# Patient Record
Sex: Female | Born: 1965 | Hispanic: No | Marital: Married | Smoking: Former smoker
Health system: Southern US, Community
[De-identification: ages and names within clinical notes are randomized; demographics above are authoritative.]

## PROBLEM LIST (undated history)

## (undated) DIAGNOSIS — I10 Essential (primary) hypertension: Secondary | ICD-10-CM

## (undated) DIAGNOSIS — K219 Gastro-esophageal reflux disease without esophagitis: Secondary | ICD-10-CM

## (undated) DIAGNOSIS — E039 Hypothyroidism, unspecified: Secondary | ICD-10-CM

## (undated) HISTORY — DX: Gastro-esophageal reflux disease without esophagitis: K21.9

## (undated) HISTORY — DX: Essential (primary) hypertension: I10

## (undated) HISTORY — PX: UPPER GI ENDOSCOPY: SHX6162

---

## 2006-11-26 ENCOUNTER — Emergency Department: Payer: Self-pay | Admitting: Emergency Medicine

## 2006-12-10 ENCOUNTER — Ambulatory Visit: Payer: Self-pay | Admitting: Internal Medicine

## 2007-05-31 ENCOUNTER — Ambulatory Visit: Payer: Self-pay | Admitting: Internal Medicine

## 2007-06-13 ENCOUNTER — Ambulatory Visit: Payer: Self-pay | Admitting: Internal Medicine

## 2007-06-24 ENCOUNTER — Ambulatory Visit: Payer: Self-pay | Admitting: Internal Medicine

## 2007-12-24 ENCOUNTER — Ambulatory Visit: Payer: Self-pay | Admitting: Internal Medicine

## 2007-12-26 HISTORY — PX: BREAST BIOPSY: SHX20

## 2008-07-09 ENCOUNTER — Ambulatory Visit: Payer: Self-pay | Admitting: Internal Medicine

## 2008-07-13 ENCOUNTER — Ambulatory Visit: Payer: Self-pay | Admitting: Internal Medicine

## 2008-09-30 ENCOUNTER — Ambulatory Visit: Payer: Self-pay | Admitting: Cardiovascular Disease

## 2008-09-30 ENCOUNTER — Ambulatory Visit: Payer: Self-pay | Admitting: Unknown Physician Specialty

## 2008-10-01 ENCOUNTER — Ambulatory Visit: Payer: Self-pay | Admitting: Unknown Physician Specialty

## 2009-03-27 HISTORY — PX: ABDOMINAL HYSTERECTOMY: SHX81

## 2009-03-29 ENCOUNTER — Ambulatory Visit: Payer: Self-pay | Admitting: Unknown Physician Specialty

## 2009-04-02 ENCOUNTER — Ambulatory Visit: Payer: Self-pay | Admitting: Unknown Physician Specialty

## 2010-09-12 ENCOUNTER — Ambulatory Visit: Payer: Self-pay | Admitting: Internal Medicine

## 2010-10-06 ENCOUNTER — Ambulatory Visit: Payer: Self-pay | Admitting: Internal Medicine

## 2010-10-14 ENCOUNTER — Observation Stay: Payer: Self-pay | Admitting: Internal Medicine

## 2010-10-25 ENCOUNTER — Ambulatory Visit: Payer: Self-pay | Admitting: Unknown Physician Specialty

## 2010-11-07 ENCOUNTER — Ambulatory Visit: Payer: Self-pay | Admitting: Unknown Physician Specialty

## 2012-02-13 LAB — HEPATIC FUNCTION PANEL
ALT: 18 U/L (ref 7–35)
AST: 16 U/L (ref 13–35)
Bilirubin, Direct: 84 mg/dL — AB

## 2012-02-13 LAB — CBC AND DIFFERENTIAL: Hemoglobin: 7.6 g/dL — AB (ref 12.0–16.0)

## 2012-02-13 LAB — BASIC METABOLIC PANEL
BUN: 9 mg/dL (ref 4–21)
Creatinine: 0.9 mg/dL (ref <=1.1)

## 2012-03-25 ENCOUNTER — Ambulatory Visit: Payer: Self-pay | Admitting: Surgery

## 2012-03-25 DIAGNOSIS — I10 Essential (primary) hypertension: Secondary | ICD-10-CM

## 2012-03-27 HISTORY — PX: CHOLECYSTECTOMY: SHX55

## 2012-04-01 ENCOUNTER — Ambulatory Visit: Payer: Self-pay | Admitting: Surgery

## 2012-04-17 ENCOUNTER — Encounter: Payer: Self-pay | Admitting: *Deleted

## 2012-04-18 ENCOUNTER — Ambulatory Visit: Payer: Self-pay | Admitting: Internal Medicine

## 2012-06-12 ENCOUNTER — Encounter: Payer: Self-pay | Admitting: Internal Medicine

## 2012-06-26 ENCOUNTER — Telehealth: Payer: Self-pay | Admitting: Internal Medicine

## 2012-06-26 ENCOUNTER — Encounter: Payer: Self-pay | Admitting: Internal Medicine

## 2012-06-26 ENCOUNTER — Ambulatory Visit (INDEPENDENT_AMBULATORY_CARE_PROVIDER_SITE_OTHER): Payer: Managed Care, Other (non HMO) | Admitting: Internal Medicine

## 2012-06-26 VITALS — BP 120/84 | HR 77 | Temp 98.1°F | Ht 62.0 in | Wt 153.5 lb

## 2012-06-26 DIAGNOSIS — R079 Chest pain, unspecified: Secondary | ICD-10-CM | POA: Insufficient documentation

## 2012-06-26 DIAGNOSIS — K219 Gastro-esophageal reflux disease without esophagitis: Secondary | ICD-10-CM | POA: Insufficient documentation

## 2012-06-26 DIAGNOSIS — E039 Hypothyroidism, unspecified: Secondary | ICD-10-CM

## 2012-06-26 DIAGNOSIS — Z1239 Encounter for other screening for malignant neoplasm of breast: Secondary | ICD-10-CM

## 2012-06-26 DIAGNOSIS — G8929 Other chronic pain: Secondary | ICD-10-CM | POA: Insufficient documentation

## 2012-06-26 DIAGNOSIS — E7800 Pure hypercholesterolemia, unspecified: Secondary | ICD-10-CM

## 2012-06-26 LAB — BASIC METABOLIC PANEL
BUN: 15 mg/dL (ref 6–23)
CO2: 25 mEq/L (ref 19–32)
Calcium: 9.5 mg/dL (ref 8.4–10.5)
Creatinine, Ser: 0.8 mg/dL (ref 0.4–1.2)
Glucose, Bld: 88 mg/dL (ref 70–99)

## 2012-06-26 LAB — CBC WITH DIFFERENTIAL/PLATELET
Eosinophils Relative: 2 % (ref 0.0–5.0)
HCT: 36.7 % (ref 36.0–46.0)
Hemoglobin: 12.5 g/dL (ref 12.0–15.0)
Lymphs Abs: 1.9 10*3/uL (ref 0.7–4.0)
MCV: 89.3 fl (ref 78.0–100.0)
Monocytes Absolute: 0.4 10*3/uL (ref 0.1–1.0)
Monocytes Relative: 5.5 % (ref 3.0–12.0)
Neutro Abs: 4.5 10*3/uL (ref 1.4–7.7)
Platelets: 345 10*3/uL (ref 150.0–400.0)
WBC: 6.9 10*3/uL (ref 4.5–10.5)

## 2012-06-26 LAB — HEPATIC FUNCTION PANEL
ALT: 17 U/L (ref 0–35)
AST: 17 U/L (ref 0–37)
Alkaline Phosphatase: 56 U/L (ref 39–117)
Total Bilirubin: 0.5 mg/dL (ref 0.3–1.2)

## 2012-06-26 LAB — LIPID PANEL: Total CHOL/HDL Ratio: 7

## 2012-06-26 MED ORDER — LEVOTHYROXINE SODIUM 50 MCG PO TABS: 50.0000 ug | ORAL_TABLET | Freq: Every day | ORAL | Status: DC

## 2012-06-26 MED ORDER — PANTOPRAZOLE SODIUM 40 MG PO TBEC: 40.0000 mg | DELAYED_RELEASE_TABLET | Freq: Two times a day (BID) | ORAL | Status: DC

## 2012-06-26 NOTE — Progress Notes (Signed)
Subjective:    [name], female    DOB: [birth year], 47 y.o.   MRN: [id number]  HPI 47 year old female with past history of GERD, chest pain (with negative cardiac work up) with previous low functioning gall bladder who is now s/p cholecystectomy.  She comes in today to follow up on these issues as well as to establish care.  She states she started having discomfort in her chest in the spring of 2012.  Thought she had pulled a muscle.  Developed worsening chest pain.  Was evaluated in the ER and apparently admitted.  Had cardiac w/up then (including stress testing) that was unrevealing.  Saw Dr Gwen Pounds.  Was sent to Dr Markham Jordan.  Had EGD.  Revealed (per her report) inflammation and a small ulcer.  Was placed on prilosec.  Now taking protonix.  Continued to have intermittent flares.  HIDA revealed gall bladder function to be 18%.  Saw Dr Katrinka Blazing.  Gallbladder removed.  Did well for a while and now is having persistent discomfort and intermittent flares.  Same discomfort.  No significant change since her cholecystectomy.  This weekend at [place] food and drank orange juice.  Increased flare after this.  Taking protonix.  Can feel acid coming up in her throat.  No dysphagia now.  No odynophagia.  No vomiting.  Some increase in bowel movements after her gallbladder surgery.  The pain is not worsened by increased activity.  She actually feels better after going up flights of stairs.  Breathing stable.     Past Medical History  Diagnosis Date  . GERD (gastroesophageal reflux disease)   . Hypertension     Outpatient Encounter Prescriptions as of 06/26/2012  Medication Sig Dispense Refill  . [DISCONTINUED] pantoprazole (PROTONIX) 20 MG tablet Take 20 mg by mouth 2 (two) times daily.      . pantoprazole (PROTONIX) 40 MG tablet Take 1 tablet (40 mg total) by mouth 2 (two) times daily.  60 tablet  2   No facility-administered encounter medications on file as of 06/26/2012.    Review of  Systems Patient denies any headache, lightheadedness or dizziness.  No sinus or allergy symptoms.  No chest tightness or palpitations.  No increased shortness of breath, cough or congestion.  No vomiting.  Increased acid reflux as outlined.  Chest discomfort.  No actual abdominal pain.  No BRBPR or melana.  Bowels more frequent after the gallbladder surgery.  No urine change.   Has had previous issues with elevated blood pressure.  Was under increased stress at the time.  Was going through a divorce.  Was seeing Dr Alison Murray.  On blood pressure medication for a while.  Has been off for a while and blood pressure has been doing fine.  Last pelvic 2012.  Is s/p hysterectomy for bleeding.      Objective:   Physical Exam Filed Vitals:   06/26/12 0853  BP: 120/84  Pulse: 77  Temp: 98.1 F (84.72 C)   47 year old female in no acute distress.   HEENT:  Nares- clear.  Oropharynx - without lesions. NECK:  Supple.  Nontender.  No audible bruit.  HEART:  Appears to be regular. LUNGS:  No crackles or wheezing audible.  Respirations even and unlabored.  RADIAL PULSE:  Equal bilaterally.    CHEST WALL:  Some minimal discomfort with palpation - anterior chest.   ABDOMEN:  Soft, nontender.  Bowel sounds present and normal.  No audible abdominal bruit.  EXTREMITIES:  No increased edema present.  DP pulses palpable and equal bilaterally.      BACK:  Non tender.   SKIN:  No rash.       Assessment & Plan:  GYN.  Is s/p hysterectomy for increased bleeding.  Has done well.  Was followed by Dr Haskel Khan.   HISTORY OF ELEVATED BLOOD PRESSURE.  Appeared to have occurred with increased stress.  Now doing well on no medication.  Follow.    GALLBLADDER DYSFUNCTION.  Is s/p cholecystectomy.  Symptoms not improved.  Pursue GI w/up as outlined.   PULMONARY.  Breathing stable.  No increased sob reported with increased activity or exertion.   HEALTH MAINTENANCE.  Schedule her for a physical next visit.  Schedule  mammogram.  Overdue.  Obtain records for review.

## 2012-06-26 NOTE — Assessment & Plan Note (Signed)
Increased acid reflux and chest discomfort as outlined.  On protonix (20mg ) per pt.  Will increase to 40mg  bid.  Will also place her on Zantac 150mg  q hs. Will obtain met b, cbc, liver panel, amylase and lipase.  Refer back to GI for question of repeat EGD vs further w/up (especially given increased acid reflux despite medications).  Follow closely.

## 2012-06-26 NOTE — Patient Instructions (Addendum)
I want you to take protonix 40mg  - 2x/day - 30 minutes before breakfast and 30 minutes before your evening meal.  Take Zantac (ranitidine) 150mg  - before bedtime.  [name] will call you with your mammogram and GI appt.

## 2012-06-26 NOTE — Assessment & Plan Note (Signed)
Has had cardiac w/up in the recent past - including negative stress test.  Feels better when going up and down stairs.  Minimal tenderness to palpation.  Will change medication regimen as outlined.  Pursue GI w/up.  Follow.

## 2012-06-26 NOTE — Telephone Encounter (Signed)
Pt notified of lab results.  Low cholesterol diet/[place] diet.  tsh elevated.  Will start synthroid q day.  Check tsh in 4 weeks.  Pt aware of these orders/instructions.  She is planing on coming in 07/25/12 (8:00) for lab.  Please put on lab schedule.  Pt aware of appt time and date.

## 2012-06-27 ENCOUNTER — Telehealth: Payer: Self-pay | Admitting: Emergency Medicine

## 2012-06-27 ENCOUNTER — Encounter: Payer: Self-pay | Admitting: Emergency Medicine

## 2012-06-27 NOTE — Telephone Encounter (Signed)
Patient called and was wondering what day you made her an apt to come see you. She knows it was May but not sure of the date/time.

## 2012-06-27 NOTE — Telephone Encounter (Signed)
Appointment made

## 2012-06-27 NOTE — Telephone Encounter (Signed)
Called with appointment dates and time for patient. Left patient a message on her call voice mail.

## 2012-06-27 NOTE — Telephone Encounter (Signed)
In reviewing the appt schedule - it looks like she is scheduled for labs 07/25/12 and appt 09/02/12.

## 2012-07-24 ENCOUNTER — Ambulatory Visit: Payer: Self-pay | Admitting: Internal Medicine

## 2012-07-25 ENCOUNTER — Other Ambulatory Visit: Payer: Self-pay | Admitting: Internal Medicine

## 2012-07-25 ENCOUNTER — Other Ambulatory Visit (INDEPENDENT_AMBULATORY_CARE_PROVIDER_SITE_OTHER): Payer: Managed Care, Other (non HMO)

## 2012-07-25 ENCOUNTER — Other Ambulatory Visit: Payer: Self-pay | Admitting: *Deleted

## 2012-07-25 DIAGNOSIS — E039 Hypothyroidism, unspecified: Secondary | ICD-10-CM

## 2012-07-25 LAB — TSH: TSH: 14.51 u[IU]/mL — ABNORMAL HIGH (ref 0.35–5.50)

## 2012-07-25 MED ORDER — LEVOTHYROXINE SODIUM 75 MCG PO TABS: 75.0000 ug | ORAL_TABLET | Freq: Every day | ORAL | Status: DC

## 2012-07-25 NOTE — Progress Notes (Signed)
Order placed for follow up tsh

## 2012-08-21 ENCOUNTER — Encounter: Payer: Self-pay | Admitting: Internal Medicine

## 2012-08-30 ENCOUNTER — Other Ambulatory Visit (INDEPENDENT_AMBULATORY_CARE_PROVIDER_SITE_OTHER): Payer: Managed Care, Other (non HMO)

## 2012-08-30 DIAGNOSIS — E039 Hypothyroidism, unspecified: Secondary | ICD-10-CM

## 2012-09-02 ENCOUNTER — Other Ambulatory Visit (HOSPITAL_COMMUNITY)
Admission: RE | Admit: 2012-09-02 | Discharge: 2012-09-02 | Disposition: A | Discharge status: Home / Self Care | Payer: Managed Care, Other (non HMO) | Source: Ambulatory Visit | Attending: Internal Medicine | Admitting: Internal Medicine

## 2012-09-02 ENCOUNTER — Encounter: Payer: Self-pay | Admitting: Internal Medicine

## 2012-09-02 ENCOUNTER — Ambulatory Visit (INDEPENDENT_AMBULATORY_CARE_PROVIDER_SITE_OTHER): Payer: Managed Care, Other (non HMO) | Admitting: Internal Medicine

## 2012-09-02 VITALS — BP 120/86 | HR 75 | Temp 98.0°F | Ht 62.0 in | Wt 151.5 lb

## 2012-09-02 DIAGNOSIS — Z733 Stress, not elsewhere classified: Secondary | ICD-10-CM

## 2012-09-02 DIAGNOSIS — F439 Reaction to severe stress, unspecified: Secondary | ICD-10-CM

## 2012-09-02 DIAGNOSIS — R079 Chest pain, unspecified: Secondary | ICD-10-CM

## 2012-09-02 DIAGNOSIS — E039 Hypothyroidism, unspecified: Secondary | ICD-10-CM

## 2012-09-02 DIAGNOSIS — Z1151 Encounter for screening for human papillomavirus (HPV): Secondary | ICD-10-CM | POA: Insufficient documentation

## 2012-09-02 DIAGNOSIS — Z124 Encounter for screening for malignant neoplasm of cervix: Secondary | ICD-10-CM

## 2012-09-02 DIAGNOSIS — Z01419 Encounter for gynecological examination (general) (routine) without abnormal findings: Secondary | ICD-10-CM | POA: Insufficient documentation

## 2012-09-02 DIAGNOSIS — K219 Gastro-esophageal reflux disease without esophagitis: Secondary | ICD-10-CM

## 2012-09-04 ENCOUNTER — Encounter: Payer: Self-pay | Admitting: Internal Medicine

## 2012-09-04 DIAGNOSIS — E039 Hypothyroidism, unspecified: Secondary | ICD-10-CM | POA: Insufficient documentation

## 2012-09-04 NOTE — Assessment & Plan Note (Signed)
Has had cardiac w/up in the recent past - including negative stress test.  Feels better when going up and down stairs.  Minimal tenderness to palpation.  Referred to GI.  Planning for EGD as outlined.

## 2012-09-04 NOTE — Assessment & Plan Note (Signed)
Increased acid reflux and chest discomfort as outlined in previous note.  On protonix bid and zantac in the evening.  Saw GI.  Planning for EGD 09/17/12.

## 2012-09-04 NOTE — Progress Notes (Signed)
Subjective:    [name], female    DOB: 04-Sep-1965, 47 y.o.   MRN: [id number]  HPI 47 year old female with past history of GERD, chest pain (with negative cardiac work up) with previous low functioning gall bladder who is now s/p cholecystectomy.  She comes in today to follow up on these issues as well as for a complete physical exam.  She states she started having discomfort in her chest in the spring of 2012.  Thought she had pulled a muscle.  Developed worsening chest pain.  Was evaluated in the ER and apparently admitted.  Had cardiac w/up then (including stress testing) that was unrevealing.  Saw Dr Gwen Pounds.  Was sent to Dr Markham Jordan.  Had EGD.  Revealed (per her report) inflammation and a small ulcer.  Was placed on prilosec.  Now taking protonix.  Continued to have intermittent flares.  HIDA revealed gall bladder function to be 18%.  Saw Dr Katrinka Blazing.  Gallbladder removed.  Did well for a while, but then symptoms returned.  See last note for details.  No dysphagia or odynophagia.  No vomiting.  Saw GI.  Is planning for an EGD 09/17/12.  Taking protonix.   Breathing stable.     Past Medical History  Diagnosis Date  . GERD (gastroesophageal reflux disease)   . Hypertension     Outpatient Encounter Prescriptions as of 09/02/2012  Medication Sig Dispense Refill  . levothyroxine (SYNTHROID, LEVOTHROID) 75 MCG tablet Take 1 tablet (75 mcg total) by mouth daily.  30 tablet  3  . pantoprazole (PROTONIX) 40 MG tablet Take 1 tablet (40 mg total) by mouth 2 (two) times daily.  60 tablet  2   No facility-administered encounter medications on file as of 09/02/2012.    Current Outpatient Prescriptions on File Prior to Visit  Medication Sig Dispense Refill  . levothyroxine (SYNTHROID, LEVOTHROID) 75 MCG tablet Take 1 tablet (75 mcg total) by mouth daily.  30 tablet  3  . pantoprazole (PROTONIX) 40 MG tablet Take 1 tablet (40 mg total) by mouth 2 (two) times daily.  60 tablet  2   No  current facility-administered medications on file prior to visit.    Review of Systems Patient denies any headache, lightheadedness or dizziness.  No sinus or allergy symptoms.  No significant chest tightness or palpitations.  No increased shortness of breath, cough or congestion.  No vomiting.  No BRBPR or melana.  Bowels more frequent after the gallbladder surgery.  No urine change.   Some persistent GI issues.  See last note for details.  On protonix.  Saw GI.  Planning for EGD 09/17/12.  Has had previous issues with elevated blood pressure.  Was under increased stress at the time.  Was going through a divorce.  Was seeing Dr Alison Murray.  On blood pressure medication for a while.  Has been off for a while.   Is s/p hysterectomy for bleeding.       Objective:   Physical Exam  Filed Vitals:   09/02/12 1036  BP: 120/86  Pulse: 75  Temp: 98 F (59.19 C)   47 year old female in no acute distress.   HEENT:  Nares- clear.  Oropharynx - without lesions. NECK:  Supple.  Nontender.  No audible bruit.  HEART:  Appears to be regular. LUNGS:  No crackles or wheezing audible.  Respirations even and unlabored.  RADIAL PULSE:  Equal bilaterally.    BREASTS:  No nipple discharge or nipple  retraction present.  Could not appreciate any distinct nodules or axillary adenopathy.  ABDOMEN:  Soft, nontender.  Bowel sounds present and normal.  No audible abdominal bruit.  GU:  Normal external genitalia.  Vaginal vault without lesions.  Cervix identified.  Pap performed. Could not appreciate any adnexal masses or tenderness.   RECTAL:  Heme negative.   EXTREMITIES:  No increased edema present.  DP pulses palpable and equal bilaterally.          Assessment & Plan:  GYN.  Is s/p hysterectomy for increased bleeding.  Has done well.  Was followed by Dr Haskel Khan.  Pelvic today.   HISTORY OF ELEVATED BLOOD PRESSURE.  Appeared to have occurred with increased stress.  Now doing well on no medication.  Slightly  elevated today.  Have her spot check her pressure and get her back in soon to reassess.  Medication if persistent elevation.   GALLBLADDER DYSFUNCTION.  Is s/p cholecystectomy.  Symptoms not improved.  Pursue GI w/up as outlined.   PULMONARY.  Breathing stable.  No increased sob reported with increased activity or exertion.  INCREASED PSYCHOSOCIAL STRESSORS.  Increased stress at work.  Also dealing with some family issues.  Discussed at length with her today.  She is in agreement for counseling.  Refer to psychiatry for evaluation.   HEALTH MAINTENANCE.  Physical today.  Is s/p hysterectomy.  Mammogram 07/24/12 - Birads I.  Referred to GI.

## 2012-09-04 NOTE — Assessment & Plan Note (Signed)
On replacement.  New diagnosis.  TSH just checked and wnl.

## 2012-09-06 ENCOUNTER — Encounter: Payer: Self-pay | Admitting: Emergency Medicine

## 2012-10-07 ENCOUNTER — Telehealth: Payer: Self-pay | Admitting: Internal Medicine

## 2012-10-07 NOTE — Telephone Encounter (Signed)
Spoke with Marcelino Duster at [place] office, the patient no showed for her visit this morning. FYI

## 2012-10-16 ENCOUNTER — Other Ambulatory Visit: Payer: Self-pay | Admitting: Internal Medicine

## 2012-10-16 ENCOUNTER — Ambulatory Visit: Payer: Self-pay | Admitting: Internal Medicine

## 2012-11-07 ENCOUNTER — Other Ambulatory Visit: Payer: Self-pay | Admitting: Internal Medicine

## 2013-08-30 ENCOUNTER — Other Ambulatory Visit: Payer: Self-pay | Admitting: Internal Medicine

## 2013-09-03 ENCOUNTER — Encounter: Payer: Self-pay | Admitting: Internal Medicine

## 2013-09-03 ENCOUNTER — Ambulatory Visit (INDEPENDENT_AMBULATORY_CARE_PROVIDER_SITE_OTHER): Payer: Managed Care, Other (non HMO) | Admitting: Internal Medicine

## 2013-09-03 VITALS — BP 110/80 | HR 63 | Temp 98.3°F | Ht 62.0 in | Wt 119.8 lb

## 2013-09-03 DIAGNOSIS — Z1239 Encounter for other screening for malignant neoplasm of breast: Secondary | ICD-10-CM

## 2013-09-03 DIAGNOSIS — E7800 Pure hypercholesterolemia, unspecified: Secondary | ICD-10-CM

## 2013-09-03 DIAGNOSIS — E039 Hypothyroidism, unspecified: Secondary | ICD-10-CM

## 2013-09-03 DIAGNOSIS — R079 Chest pain, unspecified: Secondary | ICD-10-CM

## 2013-09-03 DIAGNOSIS — G8929 Other chronic pain: Secondary | ICD-10-CM

## 2013-09-03 DIAGNOSIS — K219 Gastro-esophageal reflux disease without esophagitis: Secondary | ICD-10-CM

## 2013-09-03 LAB — COMPREHENSIVE METABOLIC PANEL
ALBUMIN: 4.1 g/dL (ref 3.5–5.2)
ALK PHOS: 41 U/L (ref 39–117)
ALT: 20 U/L (ref 0–35)
AST: 20 U/L (ref 0–37)
BUN: 9 mg/dL (ref 6–23)
CALCIUM: 9.5 mg/dL (ref 8.4–10.5)
CHLORIDE: 107 meq/L (ref 96–112)
CO2: 24 mEq/L (ref 19–32)
Creatinine, Ser: 0.8 mg/dL (ref 0.4–1.2)
GFR: 78.59 mL/min (ref >60.00)
Glucose, Bld: 72 mg/dL (ref 70–99)
POTASSIUM: 4.1 meq/L (ref 3.5–5.1)
SODIUM: 139 meq/L (ref 135–145)
TOTAL PROTEIN: 7 g/dL (ref 6.0–8.3)
Total Bilirubin: 0.4 mg/dL (ref 0.2–1.2)

## 2013-09-03 LAB — LIPID PANEL
CHOLESTEROL: 208 mg/dL — AB (ref 0–200)
HDL: 48.5 mg/dL (ref >39.00)
LDL CALC: 142 mg/dL — AB (ref 0–99)
NONHDL: 159.5
Total CHOL/HDL Ratio: 4
Triglycerides: 90 mg/dL (ref 0.0–149.0)
VLDL: 18 mg/dL (ref 0.0–40.0)

## 2013-09-03 LAB — CBC WITH DIFFERENTIAL/PLATELET
BASOS PCT: 0.8 % (ref 0.0–3.0)
Basophils Absolute: 0 10*3/uL (ref 0.0–0.1)
Eosinophils Absolute: 0.1 10*3/uL (ref 0.0–0.7)
Eosinophils Relative: 1.3 % (ref 0.0–5.0)
HCT: 40.2 % (ref 36.0–46.0)
HEMOGLOBIN: 13.6 g/dL (ref 12.0–15.0)
LYMPHS PCT: 35.6 % (ref 12.0–46.0)
Lymphs Abs: 2.1 10*3/uL (ref 0.7–4.0)
MCHC: 33.8 g/dL (ref 30.0–36.0)
MCV: 90.7 fl (ref 78.0–100.0)
MONOS PCT: 7 % (ref 3.0–12.0)
Monocytes Absolute: 0.4 10*3/uL (ref 0.1–1.0)
NEUTROS ABS: 3.3 10*3/uL (ref 1.4–7.7)
NEUTROS PCT: 55.3 % (ref 43.0–77.0)
Platelets: 342 10*3/uL (ref 150.0–400.0)
RBC: 4.44 Mil/uL (ref 3.87–5.11)
RDW: 14.2 % (ref 11.5–15.5)
WBC: 5.9 10*3/uL (ref 4.0–10.5)

## 2013-09-03 NOTE — Progress Notes (Signed)
Subjective:    [name], female    DOB: Sep 05, 1965, 48 y.o.   MRN: [id number]  HPI 48 year old female with past history of GERD, chest pain (with negative cardiac work up) with previous low functioning gall bladder who is now s/p cholecystectomy.  She comes in today to follow up on these issues as well as for a complete physical exam.  She states she started having discomfort in her chest in the spring of 2012.  Thought she had pulled a muscle.  Developed worsening chest pain.  Was evaluated in the ER and apparently admitted.  Had cardiac w/up then (including stress testing) that was unrevealing.  Saw Dr Gwen PoundsKowalski.  Was sent to Dr Markham JordanElliot.  Had EGD.  Revealed (per her report) inflammation and a small ulcer.  Was placed on prilosec.  Then changed to protonix.  HIDA revealed gall bladder function to be 18%.  Saw Dr Katrinka BlazingSmith.  Gallbladder removed.  Did well for a while, but then symptoms returned.  See last note for details.  No dysphagia or odynophagia.  No vomiting.  Saw GI.  Had f/u EGD in 7/14.  Erythematous mucosa - antrum.  She has adjusted her diet.  Feels better.  Stomach better.  As long as she watches her diet, she has no symptoms.  Not needing the protonix now.  She has had minimal allergy issues.  Staying active.  No chest pain or tightness.  Bowels stable.     Past Medical History  Diagnosis Date  . GERD (gastroesophageal reflux disease)   . Hypertension     Outpatient Encounter Prescriptions as of 09/03/2013  Medication Sig  . levothyroxine (SYNTHROID, LEVOTHROID) 75 MCG tablet TAKE 1 TABLET BY MOUTH EVERY DAY  . pantoprazole (PROTONIX) 40 MG tablet TAKE ONE TABLET BY MOUTH TWICE DAILY    Current Outpatient Prescriptions on File Prior to Visit  Medication Sig Dispense Refill  . levothyroxine (SYNTHROID, LEVOTHROID) 75 MCG tablet TAKE 1 TABLET BY MOUTH EVERY DAY  30 tablet  0  . pantoprazole (PROTONIX) 40 MG tablet TAKE ONE TABLET BY MOUTH TWICE DAILY  60 tablet   11   No current facility-administered medications on file prior to visit.    Review of Systems Patient denies any headache, lightheadedness or dizziness.  No sinus or allergy symptoms.  No chest tightness, chest pain or palpitations.  No increased shortness of breath, cough or congestion.  No vomiting.  No BRBPR or melana.  Bowels stable.  No urine change.   No acid reflux.  Doing better since adjusted her diet.  Feels good.        Objective:   Physical Exam  Filed Vitals:   09/03/13 0911  BP: 110/80  Pulse: 63  Temp: 98.3 F (8936.678 C)   48 year old female in no acute distress.   HEENT:  Nares- clear.  Oropharynx - without lesions. NECK:  Supple.  Nontender.  No audible bruit.  HEART:  Appears to be regular. LUNGS:  No crackles or wheezing audible.  Respirations even and unlabored.  RADIAL PULSE:  Equal bilaterally.    BREASTS:  No nipple discharge or nipple retraction present.  Could not appreciate any distinct nodules or axillary adenopathy.  ABDOMEN:  Soft, nontender.  Bowel sounds present and normal.  No audible abdominal bruit.  GU:  Not performed.     EXTREMITIES:  No increased edema present.  DP pulses palpable and equal bilaterally.  Assessment & Plan:  GYN.  Is s/p hysterectomy for increased bleeding.  Has done well.  Was followed by Dr Haskel Khan.  PAP 09/02/12 negative with negative HPV.    GALLBLADDER DYSFUNCTION.  Is s/p cholecystectomy.  Doing well.   PULMONARY.  Breathing stable.  No increased sob reported with increased activity or exertion.  HEALTH MAINTENANCE.  Physical today.  Is s/p hysterectomy.  Mammogram 07/24/12 - Birads I.  Schedule f/u mammogram.   I spent 25 minutes with the patient and more than 50% of the time was spent in consultation regarding the above.

## 2013-09-03 NOTE — Assessment & Plan Note (Signed)
On replacement.  Check tsh.

## 2013-09-03 NOTE — Assessment & Plan Note (Signed)
EGD as outlined.  On no medication.  Has adjusted her diet.  Doing well.  Follow.

## 2013-09-03 NOTE — Progress Notes (Signed)
Pre visit review using our clinic review tool, if applicable. No additional management support is needed unless otherwise documented below in the visit note. 

## 2013-09-03 NOTE — Assessment & Plan Note (Signed)
Low cholesterol diet and exercise.  Check lipid panel today.   

## 2013-09-03 NOTE — Assessment & Plan Note (Signed)
Has had cardiac w/up in the recent past - including negative stress test.  Had extensive GI w/up.  Has adjusted her diet.  On no medication.  Doing well.

## 2013-09-04 LAB — TSH: TSH: 5.18 u[IU]/mL — ABNORMAL HIGH (ref 0.35–4.50)

## 2013-09-05 ENCOUNTER — Other Ambulatory Visit: Payer: Self-pay | Admitting: *Deleted

## 2013-09-05 ENCOUNTER — Other Ambulatory Visit: Payer: Self-pay | Admitting: Internal Medicine

## 2013-09-05 DIAGNOSIS — E7800 Pure hypercholesterolemia, unspecified: Secondary | ICD-10-CM

## 2013-09-05 MED ORDER — LEVOTHYROXINE SODIUM 88 MCG PO TABS: ORAL_TABLET | ORAL | Status: DC

## 2013-09-05 NOTE — Progress Notes (Signed)
Order placed for f/u labs.  

## 2013-10-17 ENCOUNTER — Other Ambulatory Visit: Payer: Self-pay

## 2014-03-09 ENCOUNTER — Other Ambulatory Visit: Payer: Self-pay

## 2014-07-17 NOTE — Op Note (Signed)
PATIENT NAME:  Loletha CarrowSHIELDS, Araiyah A MR#:  [id number] DATE OF BIRTH:  [birth year]  DATE OF PROCEDURE:  04/01/2012  PREOPERATIVE DIAGNOSIS: Chronic acalculous cholecystitis.   POSTOPERATIVE DIAGNOSIS: Chronic acalculous cholecystitis.  PROCEDURE: Laparoscopic cholecystectomy.   SURGEON: Renda RollsWilton Shaterria Sager, M.D.   ANESTHESIA: General.   INDICATIONS: This 49 year old female has a history of chest pains dating back to July of 2012, some pains in the left shoulder. She has had evaluation with upper endoscopy with minimal inflammation around the cardia and active chronic gastritis. She had a hepatobiliary scan and had an abnormally low gallbladder ejection fraction of 15% and just about 2 weeks prior to surgical evaluation had more episodes of chest pain. She also has some history of fatty food intolerance and laparoscopic cholecystectomy was recommended for further treatment.   DESCRIPTION OF PROCEDURE: The patient was placed on the operating table in the supine position under general endotracheal anesthesia. The abdomen was prepared with ChloraPrep and draped in a sterile manner.  A short incision was made in the inferior aspect of the umbilicus and carried down to the deep fascia which was grasped with laryngeal hook and elevated. A Veress needle was inserted, aspirated and irrigated with a saline solution. Next, the peritoneal cavity was inflated with carbon dioxide. The Veress needle was removed. The 10 mm cannula was inserted. The 10 mm, 0 degree laparoscope was inserted to view the peritoneal cavity. The liver appeared normal. The stomach appeared normal. Another incision was made in the epigastrium just to the right of the midline to introduce an 11 mm cannula. Two incisions were made in the lateral aspect of the right upper quadrant to introduce two 5 mm cannulas.   With the patient in the reverse Trendelenburg position and turned several degrees to the left, the gallbladder was retracted towards the  right shoulder. The neck of the gallbladder was retracted inferiorly and laterally. The porta hepatis was demonstrated. The cystic duct was dissected free from surrounding structures. The cystic artery was dissected free from surrounding structures. The neck of the gallbladder was mobilized with incision of the visceral peritoneum. A critical view of safety was demonstrated. An Endoclip Clip was placed across the cystic duct adjacent to the neck of the gallbladder. An incision was made in the cystic duct to introduce a Reddick catheter; however, the cystic duct was found to be smaller than the catheter. Therefore, the catheter would not thread in and therefore a cholangiogram was not done. The Reddick catheter was removed. The cystic duct was doubly ligated with endoclips and divided. The cystic artery was controlled with double endoclips and divided. The gallbladder was dissected free from the liver with hook and cautery. There was no bleeding during this portion of the procedure. The gallbladder was completely separated and was delivered out through the infraumbilical incision and submitted in formalin for routine pathology. The right upper quadrant was further inspected. Hemostasis was intact. The cannulas were removed. Carbon dioxide was allowed to escape from the peritoneal cavity. Skin incisions were closed with interrupted 5-0 chromic subcuticular sutures, benzoin and Steri-Strips. Dressings were applied with paper tape. The patient tolerated surgery satisfactorily and was then moved to the recovery room for postoperative care. ____________________________ J. Renda RollsWilton Enes Wegener, MD jws:sb D: 04/01/2012 08:39:36 ET T: 04/01/2012 09:12:07 ET JOB#: [id number]  cc: Adella HareJ. Wilton Johnisha Louks, MD, <Dictator> Adella HareWILTON J Vinia Jemmott MD ELECTRONICALLY SIGNED 04/01/2012 19:32

## 2014-08-18 ENCOUNTER — Other Ambulatory Visit: Payer: Self-pay | Admitting: Internal Medicine

## 2014-09-22 ENCOUNTER — Ambulatory Visit (INDEPENDENT_AMBULATORY_CARE_PROVIDER_SITE_OTHER): Payer: 59 | Admitting: Internal Medicine

## 2014-09-22 ENCOUNTER — Encounter: Payer: Self-pay | Admitting: Internal Medicine

## 2014-09-22 VITALS — BP 118/80 | HR 54 | Temp 98.0°F | Ht 62.0 in | Wt 130.1 lb

## 2014-09-22 DIAGNOSIS — E7800 Pure hypercholesterolemia, unspecified: Secondary | ICD-10-CM

## 2014-09-22 DIAGNOSIS — F439 Reaction to severe stress, unspecified: Secondary | ICD-10-CM

## 2014-09-22 DIAGNOSIS — G479 Sleep disorder, unspecified: Secondary | ICD-10-CM

## 2014-09-22 DIAGNOSIS — K219 Gastro-esophageal reflux disease without esophagitis: Secondary | ICD-10-CM

## 2014-09-22 DIAGNOSIS — Z0000 Encounter for general adult medical examination without abnormal findings: Secondary | ICD-10-CM

## 2014-09-22 DIAGNOSIS — Z658 Other specified problems related to psychosocial circumstances: Secondary | ICD-10-CM

## 2014-09-22 DIAGNOSIS — E039 Hypothyroidism, unspecified: Secondary | ICD-10-CM | POA: Diagnosis not present

## 2014-09-22 DIAGNOSIS — Z1239 Encounter for other screening for malignant neoplasm of breast: Secondary | ICD-10-CM

## 2014-09-22 DIAGNOSIS — R519 Headache, unspecified: Secondary | ICD-10-CM

## 2014-09-22 DIAGNOSIS — R51 Headache: Secondary | ICD-10-CM

## 2014-09-22 MED ORDER — LEVOTHYROXINE SODIUM 88 MCG PO TABS: 88.0000 ug | ORAL_TABLET | Freq: Every day | ORAL | Status: DC

## 2014-09-22 MED ORDER — TRAZODONE HCL 50 MG PO TABS
25.0000 mg | ORAL_TABLET | Freq: Every evening | ORAL | Status: DC | PRN
Start: 2014-09-22 — End: 2014-10-15

## 2014-09-22 NOTE — Progress Notes (Signed)
Pre visit review using our clinic review tool, if applicable. No additional management support is needed unless otherwise documented below in the visit note. 

## 2014-09-22 NOTE — Progress Notes (Signed)
[name], female   DOB: 15-Jul-1965, 49 y.o.   MRN: [id number]   Subjective:    [name], female    DOB: 15-Jul-1965, 49 y.o.   MRN: [id number]  HPI  Patient here to follow up on her current medical issues as well as for a complete physical exam.   Increased stress.  Husband is a Optician, dispensingminister.  Recently left a church they had been attending for years.  Increased stress related to this.  Not sleeping.  Has trouble staying asleep.  Also reports left side headache - posterior.  Radiates to top of head.  Has occurred intermittently for several weeks.  Wakes up with headache.  Tylenol helps.  Has tried different pillows.  Stays active.  No cardiac symptoms with increased activity or exertion.  Breathing stable.  Bowels stable.  No vision change.    Past Medical History  Diagnosis Date  . GERD (gastroesophageal reflux disease)   . Hypertension     Current Outpatient Prescriptions on File Prior to Visit  Medication Sig Dispense Refill  . pantoprazole (PROTONIX) 40 MG tablet TAKE ONE TABLET BY MOUTH TWICE DAILY (Patient taking differently: TAKE ONE TABLET BY MOUTH TWICE DAILY AS NEEDED) 60 tablet 11   No current facility-administered medications on file prior to visit.    Review of Systems  Constitutional: Negative for appetite change and unexpected weight change.  HENT: Negative for congestion and sinus pressure.   Eyes: Negative for pain and visual disturbance.  Respiratory: Negative for cough, chest tightness and shortness of breath.   Cardiovascular: Negative for chest pain, palpitations and leg swelling.  Gastrointestinal: Negative for nausea, vomiting, abdominal pain and diarrhea.  Genitourinary: Negative for dysuria and difficulty urinating.  Musculoskeletal: Negative for back pain and joint swelling.  Skin: Negative for color change and rash.  Neurological: Positive for headaches (head pain as outlined. ). Negative for dizziness and  light-headedness.  Hematological: Negative for adenopathy. Does not bruise/bleed easily.  Psychiatric/Behavioral: Positive for sleep disturbance (increased stress). Negative for dysphoric mood.       Objective:     Blood pressure recheck: 138/84  Physical Exam  Constitutional: She is oriented to person, place, and time. She appears well-developed and well-nourished.  HENT:  Nose: Nose normal.  Mouth/Throat: Oropharynx is clear and moist.  Eyes: Right eye exhibits no discharge. Left eye exhibits no discharge. No scleral icterus.  Neck: Neck supple. No thyromegaly present.  Cardiovascular: Normal rate and regular rhythm.   Pulmonary/Chest: Breath sounds normal. No accessory muscle usage. No tachypnea. No respiratory distress. She has no decreased breath sounds. She has no wheezes. She has no rhonchi. Right breast exhibits no inverted nipple, no mass, no nipple discharge and no tenderness (no axillary adenopathy). Left breast exhibits no inverted nipple, no mass, no nipple discharge and no tenderness (no axilarry adenopathy).  Abdominal: Soft. Bowel sounds are normal. There is no tenderness.  Musculoskeletal: She exhibits no edema or tenderness.  Lymphadenopathy:    She has no cervical adenopathy.  Neurological: She is alert and oriented to person, place, and time.  Skin: Skin is warm. No rash noted.  Psychiatric: She has a normal mood and affect. Her behavior is normal.    BP 118/80 mmHg  Pulse 54  Temp(Src) 98 F (36.7 C) (Oral)  Ht 5\' 2"  (1.575 m)  Wt 130 lb 2 oz (59.024 kg)  BMI 23.79 kg/m2  SpO2 96%  LMP 06/26/2009 Wt Readings from Last 3 Encounters:  09/22/14 130 lb 2 oz (59.024 kg)  09/03/13 119 lb 12 oz (54.318 kg)  09/02/12 151 lb 8 oz (68.72 kg)     Lab Results  Component Value Date   WBC 5.9 09/03/2013   HGB 13.6 09/03/2013   HCT 40.2 09/03/2013   PLT 342.0 09/03/2013   GLUCOSE 72 09/03/2013   CHOL 208* 09/03/2013   TRIG 90.0 09/03/2013   HDL 48.50  09/03/2013   LDLDIRECT 146.5 06/26/2012   LDLCALC 142* 09/03/2013   ALT 20 09/03/2013   AST 20 09/03/2013   NA 139 09/03/2013   K 4.1 09/03/2013   CL 107 09/03/2013   CREATININE 0.8 09/03/2013   BUN 9 09/03/2013   CO2 24 09/03/2013   TSH 5.18* 09/03/2013       Assessment & Plan:   Problem List Items Addressed This Visit    GERD (gastroesophageal reflux disease)    On no medication.  Symptoms controlled.        Head pain    Head pain and headache as outlined.  Get her sleeping better.  Discussed further w/up, including scanning.  She wants to try the medication for sleep first.  If persistent will notify me and schedule the mri.        Relevant Medications   traZODone (DESYREL) 50 MG tablet   Health care maintenance    Physical today 09/22/14.  PAP 09/02/12 - negative with negative HPV.        Hypercholesterolemia    Low cholesterol diet and exercise.  Follow lipid panel.        Relevant Orders   Comprehensive metabolic panel   Lipid panel   Hypothyroidism    On thyroid replacement.  Follow tsh.       Relevant Medications   levothyroxine (SYNTHROID, LEVOTHROID) 88 MCG tablet   Other Relevant Orders   TSH   Sleep disturbance    Having trouble staying asleep.  Discussed at length with her.  Could be contributing to her headache.  Start trazodone as directed.  Follow.  Get her back in soon to reassess.        Relevant Orders   CBC with Differential/Platelet   Stress    Increased stress as outlined.  Start trazodone and get her sleeping better.  Follow.  Get her back in soon to reassess.         Other Visit Diagnoses    Screening breast examination    -  Primary    Breast cancer screening          I spent 25 minutes with the patient and more than 50% of the time was spent in consultation regarding the above.     Dale [place], MD

## 2014-09-23 ENCOUNTER — Other Ambulatory Visit: Payer: Self-pay | Admitting: Internal Medicine

## 2014-09-23 ENCOUNTER — Ambulatory Visit
Admission: RE | Admit: 2014-09-23 | Discharge: 2014-09-23 | Disposition: A | Discharge status: Home / Self Care | Payer: 59 | Source: Ambulatory Visit | Attending: Internal Medicine | Admitting: Internal Medicine

## 2014-09-23 DIAGNOSIS — Z1231 Encounter for screening mammogram for malignant neoplasm of breast: Secondary | ICD-10-CM | POA: Insufficient documentation

## 2014-09-23 DIAGNOSIS — R928 Other abnormal and inconclusive findings on diagnostic imaging of breast: Secondary | ICD-10-CM

## 2014-09-23 NOTE — Progress Notes (Signed)
Order placed for left breast mammogram and left breast ultrasound.   

## 2014-09-24 ENCOUNTER — Other Ambulatory Visit: Payer: Self-pay | Admitting: Internal Medicine

## 2014-09-24 ENCOUNTER — Encounter: Payer: Self-pay | Admitting: Internal Medicine

## 2014-09-24 DIAGNOSIS — G479 Sleep disorder, unspecified: Secondary | ICD-10-CM | POA: Insufficient documentation

## 2014-09-24 DIAGNOSIS — F439 Reaction to severe stress, unspecified: Secondary | ICD-10-CM | POA: Insufficient documentation

## 2014-09-24 DIAGNOSIS — N63 Unspecified lump in unspecified breast: Secondary | ICD-10-CM

## 2014-09-24 DIAGNOSIS — R51 Headache: Secondary | ICD-10-CM

## 2014-09-24 DIAGNOSIS — R519 Headache, unspecified: Secondary | ICD-10-CM | POA: Insufficient documentation

## 2014-09-24 DIAGNOSIS — Z0000 Encounter for general adult medical examination without abnormal findings: Secondary | ICD-10-CM | POA: Insufficient documentation

## 2014-09-24 DIAGNOSIS — R928 Other abnormal and inconclusive findings on diagnostic imaging of breast: Secondary | ICD-10-CM

## 2014-09-24 NOTE — Assessment & Plan Note (Signed)
Increased stress as outlined.  Start trazodone and get her sleeping better.  Follow.  Get her back in soon to reassess.

## 2014-09-24 NOTE — Assessment & Plan Note (Signed)
Physical today 09/22/14.  PAP 09/02/12 - negative with negative HPV.

## 2014-09-24 NOTE — Assessment & Plan Note (Signed)
On no medication.  Symptoms controlled.

## 2014-09-24 NOTE — Assessment & Plan Note (Signed)
On thyroid replacement.  Follow tsh.  

## 2014-09-24 NOTE — Assessment & Plan Note (Signed)
Low cholesterol diet and exercise.  Follow lipid panel.   

## 2014-09-24 NOTE — Assessment & Plan Note (Signed)
Head pain and headache as outlined.  Get her sleeping better.  Discussed further w/up, including scanning.  She wants to try the medication for sleep first.  If persistent will notify me and schedule the mri.

## 2014-09-24 NOTE — Assessment & Plan Note (Signed)
Having trouble staying asleep.  Discussed at length with her.  Could be contributing to her headache.  Start trazodone as directed.  Follow.  Get her back in soon to reassess.

## 2014-09-29 ENCOUNTER — Other Ambulatory Visit: Payer: Self-pay | Admitting: Internal Medicine

## 2014-09-29 ENCOUNTER — Ambulatory Visit
Admission: RE | Admit: 2014-09-29 | Discharge: 2014-09-29 | Disposition: A | Discharge status: Home / Self Care | Payer: 59 | Source: Ambulatory Visit | Attending: Internal Medicine | Admitting: Internal Medicine

## 2014-09-29 ENCOUNTER — Other Ambulatory Visit (INDEPENDENT_AMBULATORY_CARE_PROVIDER_SITE_OTHER): Payer: 59

## 2014-09-29 DIAGNOSIS — R928 Other abnormal and inconclusive findings on diagnostic imaging of breast: Secondary | ICD-10-CM

## 2014-09-29 DIAGNOSIS — E7800 Pure hypercholesterolemia, unspecified: Secondary | ICD-10-CM

## 2014-09-29 DIAGNOSIS — G479 Sleep disorder, unspecified: Secondary | ICD-10-CM | POA: Diagnosis not present

## 2014-09-29 DIAGNOSIS — N6002 Solitary cyst of left breast: Secondary | ICD-10-CM | POA: Diagnosis not present

## 2014-09-29 DIAGNOSIS — E039 Hypothyroidism, unspecified: Secondary | ICD-10-CM | POA: Diagnosis not present

## 2014-09-29 LAB — CBC WITH DIFFERENTIAL/PLATELET
BASOS PCT: 1 % (ref 0.0–3.0)
Basophils Absolute: 0.1 10*3/uL (ref 0.0–0.1)
EOS PCT: 2.6 % (ref 0.0–5.0)
Eosinophils Absolute: 0.2 10*3/uL (ref 0.0–0.7)
HCT: 39.8 % (ref 36.0–46.0)
Hemoglobin: 13.4 g/dL (ref 12.0–15.0)
Lymphocytes Relative: 29.1 % (ref 12.0–46.0)
Lymphs Abs: 2.1 10*3/uL (ref 0.7–4.0)
MCHC: 33.8 g/dL (ref 30.0–36.0)
MCV: 90.5 fl (ref 78.0–100.0)
MONO ABS: 0.3 10*3/uL (ref 0.1–1.0)
Monocytes Relative: 4.8 % (ref 3.0–12.0)
NEUTROS PCT: 62.5 % (ref 43.0–77.0)
Neutro Abs: 4.5 10*3/uL (ref 1.4–7.7)
PLATELETS: 370 10*3/uL (ref 150.0–400.0)
RBC: 4.4 Mil/uL (ref 3.87–5.11)
RDW: 13.2 % (ref 11.5–15.5)
WBC: 7.2 10*3/uL (ref 4.0–10.5)

## 2014-09-29 LAB — COMPREHENSIVE METABOLIC PANEL
ALK PHOS: 43 U/L (ref 39–117)
ALT: 17 U/L (ref 0–35)
AST: 20 U/L (ref 0–37)
Albumin: 4.2 g/dL (ref 3.5–5.2)
BILIRUBIN TOTAL: 0.3 mg/dL (ref 0.2–1.2)
BUN: 12 mg/dL (ref 6–23)
CO2: 25 mEq/L (ref 19–32)
Calcium: 9.5 mg/dL (ref 8.4–10.5)
Chloride: 105 mEq/L (ref 96–112)
Creatinine, Ser: 0.79 mg/dL (ref 0.40–1.20)
GFR: 82.81 mL/min (ref >60.00)
Glucose, Bld: 88 mg/dL (ref 70–99)
POTASSIUM: 4.3 meq/L (ref 3.5–5.1)
Sodium: 138 mEq/L (ref 135–145)
Total Protein: 7.3 g/dL (ref 6.0–8.3)

## 2014-09-29 LAB — LIPID PANEL
Cholesterol: 239 mg/dL — ABNORMAL HIGH (ref 0–200)
HDL: 44.2 mg/dL (ref >39.00)
LDL Cholesterol: 171 mg/dL — ABNORMAL HIGH (ref 0–99)
NonHDL: 194.8
Total CHOL/HDL Ratio: 5
Triglycerides: 120 mg/dL (ref 0.0–149.0)
VLDL: 24 mg/dL (ref 0.0–40.0)

## 2014-09-29 LAB — TSH: TSH: 5.8 u[IU]/mL — ABNORMAL HIGH (ref 0.35–4.50)

## 2014-09-30 ENCOUNTER — Other Ambulatory Visit: Payer: Self-pay | Admitting: *Deleted

## 2014-09-30 MED ORDER — LEVOTHYROXINE SODIUM 100 MCG PO TABS: 100.0000 ug | ORAL_TABLET | Freq: Every day | ORAL | Status: DC

## 2014-09-30 MED ORDER — PRAVASTATIN SODIUM 10 MG PO TABS: 10.0000 mg | ORAL_TABLET | Freq: Every day | ORAL | Status: DC

## 2014-09-30 NOTE — Telephone Encounter (Signed)
[facility]

## 2014-10-05 ENCOUNTER — Encounter: Payer: Self-pay | Admitting: Internal Medicine

## 2014-10-05 DIAGNOSIS — N6009 Solitary cyst of unspecified breast: Secondary | ICD-10-CM

## 2014-10-05 NOTE — Telephone Encounter (Signed)
Order placed for referral to general surgery.

## 2014-10-06 ENCOUNTER — Encounter: Payer: Self-pay | Admitting: General Surgery

## 2014-10-07 ENCOUNTER — Encounter: Payer: Self-pay | Admitting: *Deleted

## 2014-10-15 ENCOUNTER — Ambulatory Visit (INDEPENDENT_AMBULATORY_CARE_PROVIDER_SITE_OTHER): Payer: 59 | Admitting: General Surgery

## 2014-10-15 ENCOUNTER — Encounter: Payer: Self-pay | Admitting: General Surgery

## 2014-10-15 VITALS — BP 110/82 | HR 62 | Resp 14 | Ht 62.0 in | Wt 129.0 lb

## 2014-10-15 DIAGNOSIS — N6002 Solitary cyst of left breast: Secondary | ICD-10-CM | POA: Diagnosis not present

## 2014-10-15 NOTE — Progress Notes (Signed)
[name], female   DOB: 10-31-1965, 49 y.o.   MRN: [id number]  Chief Complaint  Patient presents with  . Other    breast cyst    HPI Dawn Lynn is a 49 y.o. female here today for a evaluation of a possible breast cyst. The most recent mammogram was done on 09-23-14. She then had added views and ultrasound on 09-29-14.  Patient does perform regular self breast checks and gets regular mammograms done. She states she has had that cyst drained in the past. Denies any breast pain. No injury or trauma.       HPI  Past Medical History  Diagnosis Date  . GERD (gastroesophageal reflux disease)   . Hypertension     Past Surgical History  Procedure Laterality Date  . Upper gi endoscopy      upper Endoscopy (10-15-2010)   . Abdominal hysterectomy  2011  . Cholecystectomy  03/2012    Dr Malissa Hippo  . Breast biopsy Left 12/26/2007    Dr Evette Cristal    Family History  Problem Relation Age of Onset  . Hypertension Mother   . Breast cancer Mother 42    negative genetic testing  . Heart disease Maternal Grandmother   . Stroke Maternal Grandmother   . Hypertension Maternal Grandmother   . Heart disease Maternal Grandfather   . Stroke Maternal Grandfather   . Hypertension Maternal Grandfather   . Diabetes Paternal Grandfather     Social History History  Substance Use Topics  . Smoking status: Former Games developer  . Smokeless tobacco: Never Used     Comment: smoked in college  . Alcohol Use: No    No Known Allergies  Current Outpatient Prescriptions  Medication Sig Dispense Refill  . levothyroxine (SYNTHROID, LEVOTHROID) 100 MCG tablet Take 1 tablet (100 mcg total) by mouth daily. 30 tablet 11  . pantoprazole (PROTONIX) 40 MG tablet TAKE ONE TABLET BY MOUTH TWICE DAILY (Patient taking differently: TAKE ONE TABLET BY MOUTH TWICE DAILY AS NEEDED) 60 tablet 11  . pravastatin (PRAVACHOL) 10 MG tablet Take 1 tablet (10 mg total) by mouth daily. 30 tablet 5   No  current facility-administered medications for this visit.    Review of Systems Review of Systems  Constitutional: Negative.   Respiratory: Negative.   Cardiovascular: Negative.     Blood pressure 110/82, pulse 62, resp. rate 14, height  (1.575 m), weight 129 lb (58.514 kg), last menstrual period 06/26/2009.  Physical Exam Physical Exam  Constitutional: She is oriented to person, place, and time. She appears well-developed and well-nourished.  HENT:  Mouth/Throat: Oropharynx is clear and moist. No oropharyngeal exudate.  Eyes: Conjunctivae are normal. No scleral icterus.  Neck: Neck supple.  Cardiovascular: Normal rate, regular rhythm and normal heart sounds.   Pulmonary/Chest: Effort normal and breath sounds normal. Right breast exhibits no inverted nipple, no mass, no nipple discharge, no skin change and no tenderness. Left breast exhibits mass. Left breast exhibits no inverted nipple, no nipple discharge, no skin change and no tenderness.    3 x 6 cm left breast cyst 3 o'clock non tender mobile.  Lymphadenopathy:    She has no cervical adenopathy.    She has no axillary adenopathy.  Neurological: She is alert and oriented to person, place, and time.  Skin: Skin is warm and dry.  Psychiatric: She has a normal mood and affect.    Data Reviewed Left breast mammogram and ultrasound dated 09/29/2014 was reviewed. Syncopal  cyst with single septation noted. BI-RADS-2.  Assessment    Asymptomatic left breast cyst.    Plan    No intervention is required. No indication for dietary modification or medication therapy.     Follow up as needed or if she notices any breast symptoms, tenderness or pain. Continue self breast exams. Call office for any new breast issues or concerns.   PCP:  Letta Median 10/17/2014, 9:06 AM

## 2014-10-15 NOTE — Patient Instructions (Addendum)
Continue self breast exams. Call office for any new breast issues or concerns. Follow up as needed or if she notices any breast symptoms, tenderness or pain. Breast Cyst A breast cyst is a sac in the breast that is filled with fluid. Breast cysts are common in women. Women can have one or many cysts. When the breasts contain many cysts, it is usually due to a noncancerous (benign) condition called fibrocystic change. These lumps form under the influence of female hormones (estrogen and progesterone). The lumps are most often located in the upper, outer portion of the breast. They are often more swollen, painful, and tender before your period starts. They usually disappear after menopause, unless you are on hormone therapy.  There are several types of cysts:  Macrocyst. This is a cyst that is about 2 in. (5.1 cm) in diameter.   Microcyst. This is a tiny cyst that you cannot feel but can be seen with a mammogram or an ultrasound.   Galactocele. This is a cyst containing milk that may develop if you suddenly stop breastfeeding.   Sebaceous cyst of the skin. This type of cyst is not in the breast tissue itself. Breast cysts do not increase your risk of breast cancer. However, they must be monitored closely because they can be cancerous.  CAUSES  It is not known exactly what causes a breast cyst to form. Possible causes include:  An overgrowth of milk glands and connective tissue in the breast can block the milk glands, causing them to fill with fluid.   Scar tissue in the breast from previous surgery may block the glands, causing a cyst.  RISK FACTORS Estrogen may influence the development of a breast cyst.  SIGNS AND SYMPTOMS   Feeling a smooth, round, soft lump (like a grape) in the breast that is easily moveable.   Breast discomfort or pain.  Increase in size of the lump before your menstrual period and decrease in its size after your menstrual period.  DIAGNOSIS  A cyst can be  felt during a physical exam by your health care provider. A breast X-ray exam (mammogram) and ultrasonography will be done to confirm the diagnosis. Fluid may be removed from the cyst with a needle (fine needle aspiration) to make sure the cyst is not cancerous.  TREATMENT  Treatment may not be necessary. Your health care provider may monitor the cyst to see if it goes away on its own. If treatment is needed, it may include:  Hormone treatment.   Needle aspiration. There is a chance of the cyst coming back after aspiration.   Surgery to remove the whole cyst.  HOME CARE INSTRUCTIONS   Keep all follow-up appointments with your health care provider.  See your health care provider regularly:  Get a yearly exam by your health care provider.  Have a clinical breast exam by a health care provider every 1-3 years if you are 55-17 years of age. After age 39 years, you should have the exam every year.   Get mammogram tests as directed by your health care provider.   Understand the normal appearance and feel of your breasts and perform breast self-exams.   Only take over-the-counter or prescription medicines as directed by your health care provider.   Wear a supportive bra, especially when exercising.   Avoid caffeine.   Reduce your salt intake, especially before your menstrual period. Too much salt can cause fluid retention, breast swelling, and discomfort.  SEEK MEDICAL CARE IF:  You feel, or think you feel, a lump in your breast.   You notice that both breasts look or feel different than usual.   Your breast is still causing pain after your menstrual period is over.   You need medicine for breast pain and swelling that occurs with your menstrual period.  SEEK IMMEDIATE MEDICAL CARE IF:   You have severe pain, tenderness, redness, or warmth in your breast.   You have nipple discharge or bleeding.   Your breast lump becomes hard and painful.   You find new  lumps or bumps that were not there before.   You feel lumps in your armpit (axilla).   You notice dimpling or wrinkling of the breast or nipple.   You have a fever.  MAKE SURE YOU:  Understand these instructions.  Will watch your condition.  Will get help right away if you are not doing well or get worse. Document Released: 03/13/2005 Document Revised: 11/13/2012 Document Reviewed: 10/10/2012 [facility] Patient Information 2015 [place], [place]. This information is not intended to replace advice given to you by your health care provider. Make sure you discuss any questions you have with your health care provider.

## 2014-10-17 DIAGNOSIS — N6009 Solitary cyst of unspecified breast: Secondary | ICD-10-CM | POA: Insufficient documentation

## 2014-10-20 ENCOUNTER — Ambulatory Visit: Payer: 59 | Admitting: Internal Medicine

## 2014-11-10 ENCOUNTER — Ambulatory Visit (INDEPENDENT_AMBULATORY_CARE_PROVIDER_SITE_OTHER): Payer: 59 | Admitting: Internal Medicine

## 2014-11-10 ENCOUNTER — Encounter: Payer: Self-pay | Admitting: Internal Medicine

## 2014-11-10 ENCOUNTER — Other Ambulatory Visit: Payer: 59

## 2014-11-10 VITALS — BP 118/72 | HR 68 | Temp 97.6°F | Ht 62.0 in | Wt 133.2 lb

## 2014-11-10 DIAGNOSIS — E7800 Pure hypercholesterolemia, unspecified: Secondary | ICD-10-CM

## 2014-11-10 DIAGNOSIS — N6002 Solitary cyst of left breast: Secondary | ICD-10-CM

## 2014-11-10 DIAGNOSIS — F439 Reaction to severe stress, unspecified: Secondary | ICD-10-CM

## 2014-11-10 DIAGNOSIS — R51 Headache: Secondary | ICD-10-CM | POA: Diagnosis not present

## 2014-11-10 DIAGNOSIS — E039 Hypothyroidism, unspecified: Secondary | ICD-10-CM

## 2014-11-10 DIAGNOSIS — G479 Sleep disorder, unspecified: Secondary | ICD-10-CM

## 2014-11-10 DIAGNOSIS — Z658 Other specified problems related to psychosocial circumstances: Secondary | ICD-10-CM

## 2014-11-10 DIAGNOSIS — R519 Headache, unspecified: Secondary | ICD-10-CM

## 2014-11-10 LAB — HEPATIC FUNCTION PANEL
ALBUMIN: 4.5 g/dL (ref 3.5–5.2)
ALT: 26 U/L (ref 0–35)
AST: 26 U/L (ref 0–37)
Alkaline Phosphatase: 54 U/L (ref 39–117)
Bilirubin, Direct: 0.1 mg/dL (ref 0.0–0.3)
Total Bilirubin: 0.4 mg/dL (ref 0.2–1.2)
Total Protein: 7 g/dL (ref 6.0–8.3)

## 2014-11-10 LAB — TSH: TSH: 0.87 u[IU]/mL (ref 0.35–4.50)

## 2014-11-10 NOTE — Progress Notes (Signed)
[name], female   DOB: 04-20-1965, 49 y.o.   MRN: [id number]   Subjective:    [name], female    DOB: 14-Feb-1966, 49 y.o.   MRN: [id number]  HPI  Patient here for a scheduled follow up.  Last visit, we started her on trazodone.  She reports sleeping better.  Tolerating the medication.  Head is better.  Stays active.  No cardiac symptoms with increased activity or exertion.  No sob.  Eating and drinking well.  Bowels stable.  Still with increased stress.  Feels she is handling better, especially since sleeping better.  Recently started pravastatin.  Tolerating.  Synthroid dose increased.     Past Medical History  Diagnosis Date  . GERD (gastroesophageal reflux disease)   . Hypertension     Family history and social history reviewed.     Outpatient Encounter Prescriptions as of 11/10/2014  Medication Sig  . levothyroxine (SYNTHROID, LEVOTHROID) 100 MCG tablet Take 1 tablet (100 mcg total) by mouth daily.  . pantoprazole (PROTONIX) 40 MG tablet TAKE ONE TABLET BY MOUTH TWICE DAILY (Patient taking differently: TAKE ONE TABLET BY MOUTH TWICE DAILY AS NEEDED)  . pravastatin (PRAVACHOL) 10 MG tablet Take 1 tablet (10 mg total) by mouth daily.  . traZODone (DESYREL) 50 MG tablet Take 50 mg by mouth at bedtime.    No facility-administered encounter medications on file as of 11/10/2014.    Review of Systems  Constitutional: Negative for appetite change and fatigue.  HENT: Negative for congestion and sinus pressure.        Head pain is better.    Eyes: Negative for discharge and visual disturbance.  Respiratory: Negative for cough, chest tightness and shortness of breath.   Cardiovascular: Negative for chest pain, palpitations and leg swelling.  Gastrointestinal: Negative for nausea, vomiting, abdominal pain and diarrhea.  Neurological: Negative for dizziness, light-headedness and headaches.  Psychiatric/Behavioral: Negative for dysphoric  mood and agitation.       Sleeping better.  Handling stress better.         Objective:    Physical Exam  Constitutional: She appears well-developed and well-nourished. No distress.  HENT:  Nose: Nose normal.  Mouth/Throat: Oropharynx is clear and moist.  Neck: Neck supple. No thyromegaly present.  Cardiovascular: Normal rate and regular rhythm.   Pulmonary/Chest: Breath sounds normal. No respiratory distress. She has no wheezes.  Abdominal: Soft. Bowel sounds are normal. There is no tenderness.  Musculoskeletal: She exhibits no edema or tenderness.  Lymphadenopathy:    She has no cervical adenopathy.  Skin: No rash noted. No erythema.  Psychiatric: She has a normal mood and affect. Her behavior is normal.    BP 118/72 mmHg  Pulse 68  Temp(Src) 97.6 F (36.4 C) (Oral)  Ht 5\' 2"  (1.575 m)  Wt 133 lb 3.2 oz (60.419 kg)  BMI 24.36 kg/m2  SpO2 94%  LMP 06/26/2009 Wt Readings from Last 3 Encounters:  11/10/14 133 lb 3.2 oz (60.419 kg)  10/15/14 129 lb (58.514 kg)  09/22/14 130 lb 2 oz (59.024 kg)     Lab Results  Component Value Date   WBC 7.2 09/29/2014   HGB 13.4 09/29/2014   HCT 39.8 09/29/2014   PLT 370.0 09/29/2014   GLUCOSE 88 09/29/2014   CHOL 239* 09/29/2014   TRIG 120.0 09/29/2014   HDL 44.20 09/29/2014   LDLDIRECT 146.5 06/26/2012   LDLCALC 171* 09/29/2014   ALT 26 11/10/2014   AST 26 11/10/2014  NA 138 09/29/2014   K 4.3 09/29/2014   CL 105 09/29/2014   CREATININE 0.79 09/29/2014   BUN 12 09/29/2014   CO2 25 09/29/2014   TSH 0.87 11/10/2014    US Breast Ltd Uni Left Inc Axilla  09/29/2014   CLINICAL DATA:  49 year old female, callback from screening mammogram for possible left breast mass.  EXAM: DIGITAL DIAGNOSTIC LEFT MAMMOGRAM WITH 3D TOMOSYNTHESIS WITH CAD  ULTRASOUND LEFT BREAST  COMPARISON:  09/23/2014, additional prior studies dating back to 06/13/2007  ACR Breast Density Category c: The breast tissue is heterogeneously dense, which may  obscure small masses.  FINDINGS: CC and MLO spot-compression views of the left breast with tomosynthesis were performed. On the additional views, there is a large, oval circumscribed mass within the left breast at 3 o'clock. No additional abnormality is identified.  Mammographic images were processed with CAD.  Targeted ultrasound is performed showing a large cyst at 3 o'clock, 1 cm from the nipple measuring 5.1 x 5.5 x 2.1 cm containing a single thin septation and internal debris. No solid component or internal vascularity is identified. The patient reports mild associated tenderness with the cyst.  IMPRESSION: Left breast cyst.  RECOMMENDATION: 1. No mammographic or sonographic evidence of malignancy. The patient wishes to pursue cyst aspiration due to the associated tenderness. 2.  Screening mammogram in one year.(Code:SM-B-01Y)  I have discussed the findings and recommendations with the patient. Results were also provided in writing at the conclusion of the visit. If applicable, a reminder letter will be sent to the patient regarding the next appointment.  BI-RADS CATEGORY  2: Benign.   Electronically Signed   By: Dalphine Handing M.D.   On: 09/29/2014 11:41   Mm Diag Breast Tomo Uni Left  09/29/2014   CLINICAL DATA:  49 year old female, callback from screening mammogram for possible left breast mass.  EXAM: DIGITAL DIAGNOSTIC LEFT MAMMOGRAM WITH 3D TOMOSYNTHESIS WITH CAD  ULTRASOUND LEFT BREAST  COMPARISON:  09/23/2014, additional prior studies dating back to 06/13/2007  ACR Breast Density Category c: The breast tissue is heterogeneously dense, which may obscure small masses.  FINDINGS: CC and MLO spot-compression views of the left breast with tomosynthesis were performed. On the additional views, there is a large, oval circumscribed mass within the left breast at 3 o'clock. No additional abnormality is identified.  Mammographic images were processed with CAD.  Targeted ultrasound is performed showing a large cyst  at 3 o'clock, 1 cm from the nipple measuring 5.1 x 5.5 x 2.1 cm containing a single thin septation and internal debris. No solid component or internal vascularity is identified. The patient reports mild associated tenderness with the cyst.  IMPRESSION: Left breast cyst.  RECOMMENDATION: 1. No mammographic or sonographic evidence of malignancy. The patient wishes to pursue cyst aspiration due to the associated tenderness. 2.  Screening mammogram in one year.(Code:SM-B-01Y)  I have discussed the findings and recommendations with the patient. Results were also provided in writing at the conclusion of the visit. If applicable, a reminder letter will be sent to the patient regarding the next appointment.  BI-RADS CATEGORY  2: Benign.   Electronically Signed   By: Dalphine Handing M.D.   On: 09/29/2014 11:41       Assessment & Plan:   Problem List Items Addressed This Visit    Breast cyst    F/u mammogram and ultrasound 09/2014 - [facility].  Saw Dr Lemar Livings for breast cyst.  Felt no further intervention warranted at this time.  Head pain    Resolved.        Relevant Medications   traZODone (DESYREL) 50 MG tablet   Hypercholesterolemia    Low cholesterol diet and exercise.  Recently started on pravastatin.  Check liver panel today.        Relevant Orders   TSH (Completed)   Hypothyroidism - Primary    Recently increased her synthroid.  Recheck tsh.        Relevant Orders   Hepatic function panel (Completed)   Sleep disturbance    Doing better on trazodone.  Follow.       Stress    Sleeping better.  Handling stress better.  Follow.            Dale Brigantine, MD

## 2014-11-10 NOTE — Progress Notes (Signed)
Pre visit review using our clinic review tool, if applicable. No additional management support is needed unless otherwise documented below in the visit note. 

## 2014-11-11 ENCOUNTER — Encounter: Payer: Self-pay | Admitting: Internal Medicine

## 2014-11-12 ENCOUNTER — Encounter: Payer: Self-pay | Admitting: Internal Medicine

## 2014-11-12 NOTE — Assessment & Plan Note (Signed)
Resolved

## 2014-11-12 NOTE — Assessment & Plan Note (Signed)
F/u mammogram and ultrasound 09/2014 - [facility].  Saw Dr Lemar Livings for breast cyst.  Felt no further intervention warranted at this time.

## 2014-11-12 NOTE — Telephone Encounter (Signed)
Unread mychart message mailed to patient 

## 2014-11-12 NOTE — Assessment & Plan Note (Signed)
Sleeping better.  Handling stress better.  Follow.

## 2014-11-12 NOTE — Assessment & Plan Note (Signed)
Doing better on trazodone.  Follow.

## 2014-11-12 NOTE — Assessment & Plan Note (Signed)
Recently increased her synthroid.  Recheck tsh.

## 2014-11-12 NOTE — Assessment & Plan Note (Signed)
Low cholesterol diet and exercise.  Recently started on pravastatin.  Check liver panel today.

## 2014-11-27 ENCOUNTER — Other Ambulatory Visit: Payer: Self-pay | Admitting: Internal Medicine

## 2014-11-27 ENCOUNTER — Encounter: Payer: Self-pay | Admitting: Internal Medicine

## 2014-11-27 NOTE — Telephone Encounter (Signed)
Last OV 8.16.16.  Appears you have never filled Rx before.  Please advise

## 2014-11-27 NOTE — Telephone Encounter (Signed)
Refilled trazodone #30 with one refill.   

## 2015-01-01 ENCOUNTER — Encounter: Payer: Self-pay | Admitting: Internal Medicine

## 2015-01-01 MED ORDER — TRAZODONE HCL 50 MG PO TABS: ORAL_TABLET | ORAL | Status: DC

## 2015-01-01 NOTE — Telephone Encounter (Signed)
rx sent in for trazodone to take 1 1/2 tablet q hs #45 with one refill.  Pt notified via my chart.  Increased dose per request.

## 2015-01-23 ENCOUNTER — Other Ambulatory Visit: Payer: Self-pay | Admitting: Internal Medicine

## 2015-02-11 ENCOUNTER — Ambulatory Visit: Payer: 59 | Admitting: Internal Medicine

## 2015-02-22 ENCOUNTER — Telehealth: Payer: Self-pay | Admitting: Internal Medicine

## 2015-02-22 ENCOUNTER — Ambulatory Visit: Payer: 59 | Admitting: Internal Medicine

## 2015-02-22 NOTE — Telephone Encounter (Signed)
FYI,Pt called about not able to make appt pt states she forgot due to appt being rescheduled. Pt appt is still on schedule. Pt is aware of $50 cancellation fee. Pt is rescheduling appt. Thank You!

## 2015-02-22 NOTE — Telephone Encounter (Signed)
See below

## 2015-02-22 NOTE — Telephone Encounter (Signed)
Ok to take off schedule 

## 2015-03-17 ENCOUNTER — Other Ambulatory Visit: Payer: Self-pay | Admitting: Internal Medicine

## 2015-03-18 NOTE — Telephone Encounter (Signed)
Please advise 

## 2015-03-19 NOTE — Telephone Encounter (Signed)
ok'd rx for trazodone #45 with one refill.

## 2015-04-23 ENCOUNTER — Ambulatory Visit (INDEPENDENT_AMBULATORY_CARE_PROVIDER_SITE_OTHER): Payer: 59 | Admitting: Internal Medicine

## 2015-04-23 ENCOUNTER — Encounter: Payer: Self-pay | Admitting: Internal Medicine

## 2015-04-23 VITALS — BP 118/80 | HR 71 | Temp 97.6°F | Resp 18 | Ht 62.0 in | Wt 138.1 lb

## 2015-04-23 DIAGNOSIS — E7800 Pure hypercholesterolemia, unspecified: Secondary | ICD-10-CM

## 2015-04-23 DIAGNOSIS — Z658 Other specified problems related to psychosocial circumstances: Secondary | ICD-10-CM | POA: Diagnosis not present

## 2015-04-23 DIAGNOSIS — G479 Sleep disorder, unspecified: Secondary | ICD-10-CM

## 2015-04-23 DIAGNOSIS — Z7184 Encounter for health counseling related to travel: Secondary | ICD-10-CM

## 2015-04-23 DIAGNOSIS — Z23 Encounter for immunization: Secondary | ICD-10-CM | POA: Diagnosis not present

## 2015-04-23 DIAGNOSIS — K219 Gastro-esophageal reflux disease without esophagitis: Secondary | ICD-10-CM

## 2015-04-23 DIAGNOSIS — Z7189 Other specified counseling: Secondary | ICD-10-CM

## 2015-04-23 DIAGNOSIS — E039 Hypothyroidism, unspecified: Secondary | ICD-10-CM | POA: Diagnosis not present

## 2015-04-23 DIAGNOSIS — F439 Reaction to severe stress, unspecified: Secondary | ICD-10-CM

## 2015-04-23 DIAGNOSIS — N6002 Solitary cyst of left breast: Secondary | ICD-10-CM

## 2015-04-23 LAB — TSH: TSH: 0.69 u[IU]/mL (ref 0.35–4.50)

## 2015-04-23 MED ORDER — AZITHROMYCIN 250 MG PO TABS: ORAL_TABLET | ORAL | Status: DC

## 2015-04-23 MED ORDER — PRAVASTATIN SODIUM 10 MG PO TABS: 10.0000 mg | ORAL_TABLET | Freq: Every day | ORAL | Status: DC

## 2015-04-23 NOTE — Progress Notes (Signed)
[name], female   DOB: [birth year], 50 y.o.   MRN: [id number]   Subjective:    [name], female    DOB: 04-02-1965, 50 y.o.   MRN: [id number]  HPI  Patient with past history of GERD and hypothyroidism.  She comes in today to follow up on these issues.  Has also been on trazodone to help with sleep.  This is working well for her.  She has just started boot camp.  Discussed diet and exercise.  Planning to go to [place].  Wants abx for traveller's diarrhea.  cipro makes her sick.  Discussed using compression hose for travel.  Discussed walking and moving as much as possible.     Past Medical History  Diagnosis Date  . GERD (gastroesophageal reflux disease)   . Hypertension    Past Surgical History  Procedure Laterality Date  . Upper gi endoscopy      upper Endoscopy (10-15-2010)   . Abdominal hysterectomy  2011  . Cholecystectomy  03/2012    Dr Malissa Hippo  . Breast biopsy Left 12/26/2007    Dr Evette Cristal   Family History  Problem Relation Age of Onset  . Hypertension Mother   . Breast cancer Mother 28    negative genetic testing  . Heart disease Maternal Grandmother   . Stroke Maternal Grandmother   . Hypertension Maternal Grandmother   . Heart disease Maternal Grandfather   . Stroke Maternal Grandfather   . Hypertension Maternal Grandfather   . Diabetes Paternal Grandfather    Social History   Social History  . Marital Status: Married    Spouse Name: N/A  . Number of Children: 2  . Years of Education: N/A   Occupational History  .     Social History Main Topics  . Smoking status: Former Games developer  . Smokeless tobacco: Never Used     Comment: smoked in college  . Alcohol Use: No  . Drug Use: No  . Sexual Activity: Not Asked   Other Topics Concern  . None   Social History Narrative    Outpatient Encounter Prescriptions as of 04/23/2015  Medication Sig  . levothyroxine (SYNTHROID, LEVOTHROID) 100 MCG tablet Take 1 tablet  (100 mcg total) by mouth daily.  . pantoprazole (PROTONIX) 40 MG tablet TAKE ONE TABLET BY MOUTH TWICE DAILY (Patient taking differently: TAKE ONE TABLET BY MOUTH TWICE DAILY AS NEEDED)  . pravastatin (PRAVACHOL) 10 MG tablet Take 1 tablet (10 mg total) by mouth daily.  . traZODone (DESYREL) 50 MG tablet 1.5 TABS BY MOUTH AT BEDTIME  . [DISCONTINUED] pravastatin (PRAVACHOL) 10 MG tablet Take 1 tablet (10 mg total) by mouth daily.  Marland Kitchen azithromycin (ZITHROMAX) 250 MG tablet Take two tablets x 1 day and then one tablet per day for four more days.   No facility-administered encounter medications on file as of 04/23/2015.    Review of Systems  Constitutional: Negative for fever and appetite change.  HENT: Negative for congestion and sinus pressure.   Respiratory: Negative for cough, chest tightness and shortness of breath.   Cardiovascular: Negative for chest pain, palpitations and leg swelling.  Gastrointestinal: Negative for nausea, vomiting, abdominal pain and diarrhea.  Genitourinary: Negative for dysuria and difficulty urinating.  Musculoskeletal: Negative for back pain and joint swelling.  Skin: Negative for color change and rash.  Neurological: Negative for dizziness, light-headedness and headaches.  Psychiatric/Behavioral: Negative for dysphoric mood and agitation.       Objective:  Physical Exam  Constitutional: She appears well-developed and well-nourished. No distress.  HENT:  Nose: Nose normal.  Mouth/Throat: Oropharynx is clear and moist.  Neck: Neck supple. No thyromegaly present.  Cardiovascular: Normal rate and regular rhythm.   Pulmonary/Chest: Breath sounds normal. No respiratory distress. She has no wheezes.  Abdominal: Soft. Bowel sounds are normal. There is no tenderness.  Musculoskeletal: She exhibits no edema or tenderness.  Lymphadenopathy:    She has no cervical adenopathy.  Skin: No rash noted. No erythema.  Psychiatric: She has a normal mood and affect.  Her behavior is normal.    BP 118/80 mmHg  Pulse 71  Temp(Src) 97.6 F (36.4 C) (Oral)  Resp 18  Ht  (1.575 m)  Wt 138 lb 2 oz (62.653 kg)  BMI 25.26 kg/m2  SpO2 98%  LMP 06/26/2009 Wt Readings from Last 3 Encounters:  04/23/15 138 lb 2 oz (62.653 kg)  11/10/14 133 lb 3.2 oz (60.419 kg)  10/15/14 129 lb (58.514 kg)     Lab Results  Component Value Date   WBC 7.2 09/29/2014   HGB 13.4 09/29/2014   HCT 39.8 09/29/2014   PLT 370.0 09/29/2014   GLUCOSE 88 09/29/2014   CHOL 239* 09/29/2014   TRIG 120.0 09/29/2014   HDL 44.20 09/29/2014   LDLDIRECT 146.5 06/26/2012   LDLCALC 171* 09/29/2014   ALT 26 11/10/2014   AST 26 11/10/2014   NA 138 09/29/2014   K 4.3 09/29/2014   CL 105 09/29/2014   CREATININE 0.79 09/29/2014   BUN 12 09/29/2014   CO2 25 09/29/2014   TSH 0.87 11/10/2014    US Breast Ltd Uni Left Inc Axilla  09/29/2014  CLINICAL DATA:  50 year old female, callback from screening mammogram for possible left breast mass. EXAM: DIGITAL DIAGNOSTIC LEFT MAMMOGRAM WITH 3D TOMOSYNTHESIS WITH CAD ULTRASOUND LEFT BREAST COMPARISON:  09/23/2014, additional prior studies dating back to 06/13/2007 ACR Breast Density Category c: The breast tissue is heterogeneously dense, which may obscure small masses. FINDINGS: CC and MLO spot-compression views of the left breast with tomosynthesis were performed. On the additional views, there is a large, oval circumscribed mass within the left breast at 3 o'clock. No additional abnormality is identified. Mammographic images were processed with CAD. Targeted ultrasound is performed showing a large cyst at 3 o'clock, 1 cm from the nipple measuring 5.1 x 5.5 x 2.1 cm containing a single thin septation and internal debris. No solid component or internal vascularity is identified. The patient reports mild associated tenderness with the cyst. IMPRESSION: Left breast cyst. RECOMMENDATION: 1. No mammographic or sonographic evidence of malignancy. The  patient wishes to pursue cyst aspiration due to the associated tenderness. 2.  Screening mammogram in one year.(Code:SM-B-01Y) I have discussed the findings and recommendations with the patient. Results were also provided in writing at the conclusion of the visit. If applicable, a reminder letter will be sent to the patient regarding the next appointment. BI-RADS CATEGORY  2: Benign. Electronically Signed   By: Dalphine Handing M.D.   On: 09/29/2014 11:41   Mm Diag Breast Tomo Uni Left  09/29/2014  CLINICAL DATA:  50 year old female, callback from screening mammogram for possible left breast mass. EXAM: DIGITAL DIAGNOSTIC LEFT MAMMOGRAM WITH 3D TOMOSYNTHESIS WITH CAD ULTRASOUND LEFT BREAST COMPARISON:  09/23/2014, additional prior studies dating back to 06/13/2007 ACR Breast Density Category c: The breast tissue is heterogeneously dense, which may obscure small masses. FINDINGS: CC and MLO spot-compression views of the left breast with tomosynthesis were  performed. On the additional views, there is a large, oval circumscribed mass within the left breast at 3 o'clock. No additional abnormality is identified. Mammographic images were processed with CAD. Targeted ultrasound is performed showing a large cyst at 3 o'clock, 1 cm from the nipple measuring 5.1 x 5.5 x 2.1 cm containing a single thin septation and internal debris. No solid component or internal vascularity is identified. The patient reports mild associated tenderness with the cyst. IMPRESSION: Left breast cyst. RECOMMENDATION: 1. No mammographic or sonographic evidence of malignancy. The patient wishes to pursue cyst aspiration due to the associated tenderness. 2.  Screening mammogram in one year.(Code:SM-B-01Y) I have discussed the findings and recommendations with the patient. Results were also provided in writing at the conclusion of the visit. If applicable, a reminder letter will be sent to the patient regarding the next appointment. BI-RADS CATEGORY  2:  Benign. Electronically Signed   By: Dalphine Handing M.D.   On: 09/29/2014 11:41       Assessment & Plan:   Problem List Items Addressed This Visit    None       Dale [place] Francois, MD

## 2015-04-23 NOTE — Progress Notes (Signed)
Pre-visit discussion using our clinic review tool. No additional management support is needed unless otherwise documented below in the visit note.  

## 2015-04-25 ENCOUNTER — Encounter: Payer: Self-pay | Admitting: Internal Medicine

## 2015-04-25 DIAGNOSIS — Z7184 Encounter for health counseling related to travel: Secondary | ICD-10-CM | POA: Insufficient documentation

## 2015-04-25 NOTE — Assessment & Plan Note (Signed)
On trazodone. Working well for her.  Handling stress better since sleeping better.  Follow.

## 2015-04-25 NOTE — Assessment & Plan Note (Signed)
Sleeping better.  On trazodone.  Working well for her.  Follow.

## 2015-04-25 NOTE — Assessment & Plan Note (Signed)
F/u mammogram and ultrasound 09/2014 as outlined.  Recommended f/u in one year.  Saw Dr Lemar Livings.

## 2015-04-25 NOTE — Assessment & Plan Note (Signed)
On thyroid replacement.  Follow tsh.  Recheck today.    

## 2015-04-25 NOTE — Assessment & Plan Note (Signed)
Low cholesterol diet and exercise.  Follow lipid panel.  She is exercising.  Going to boot camp.

## 2015-04-25 NOTE — Assessment & Plan Note (Signed)
On protonix.  Symptoms controlled.   

## 2015-04-25 NOTE — Assessment & Plan Note (Signed)
Flu shot given today.  zpak given to take with her.  See note.

## 2015-05-11 ENCOUNTER — Other Ambulatory Visit: Payer: Self-pay | Admitting: Internal Medicine

## 2015-05-11 NOTE — Telephone Encounter (Signed)
Last OV 04/17/15 ok to refill trazodone?

## 2015-05-11 NOTE — Telephone Encounter (Signed)
ok'd refill for trazodone #45 with one refill.   

## 2015-05-20 ENCOUNTER — Encounter: Payer: Self-pay | Admitting: Internal Medicine

## 2015-05-21 MED ORDER — OSELTAMIVIR PHOSPHATE 75 MG PO CAPS: 75.0000 mg | ORAL_CAPSULE | Freq: Every day | ORAL | Status: DC

## 2015-05-21 NOTE — Telephone Encounter (Signed)
rx sent in for tamiflu to [facility] in [place].  See my chart message.

## 2015-07-18 ENCOUNTER — Other Ambulatory Visit: Payer: Self-pay | Admitting: Internal Medicine

## 2015-07-23 ENCOUNTER — Ambulatory Visit: Payer: 59 | Admitting: Internal Medicine

## 2015-09-22 ENCOUNTER — Other Ambulatory Visit: Payer: Self-pay | Admitting: Internal Medicine

## 2015-11-24 ENCOUNTER — Encounter: Payer: Self-pay | Admitting: Internal Medicine

## 2015-11-24 ENCOUNTER — Ambulatory Visit (INDEPENDENT_AMBULATORY_CARE_PROVIDER_SITE_OTHER): Payer: 59 | Admitting: Internal Medicine

## 2015-11-24 VITALS — BP 132/82 | HR 68 | Temp 97.8°F | Resp 16 | Ht 61.75 in | Wt 139.0 lb

## 2015-11-24 DIAGNOSIS — E7800 Pure hypercholesterolemia, unspecified: Secondary | ICD-10-CM

## 2015-11-24 DIAGNOSIS — E039 Hypothyroidism, unspecified: Secondary | ICD-10-CM | POA: Diagnosis not present

## 2015-11-24 DIAGNOSIS — G479 Sleep disorder, unspecified: Secondary | ICD-10-CM

## 2015-11-24 DIAGNOSIS — Z8249 Family history of ischemic heart disease and other diseases of the circulatory system: Secondary | ICD-10-CM

## 2015-11-24 DIAGNOSIS — Z658 Other specified problems related to psychosocial circumstances: Secondary | ICD-10-CM

## 2015-11-24 DIAGNOSIS — H6121 Impacted cerumen, right ear: Secondary | ICD-10-CM

## 2015-11-24 DIAGNOSIS — Z0000 Encounter for general adult medical examination without abnormal findings: Secondary | ICD-10-CM

## 2015-11-24 DIAGNOSIS — F439 Reaction to severe stress, unspecified: Secondary | ICD-10-CM

## 2015-11-24 DIAGNOSIS — K219 Gastro-esophageal reflux disease without esophagitis: Secondary | ICD-10-CM

## 2015-11-24 LAB — TSH: TSH: 1.05 u[IU]/mL (ref 0.35–4.50)

## 2015-11-24 LAB — COMPREHENSIVE METABOLIC PANEL
ALBUMIN: 4.5 g/dL (ref 3.5–5.2)
ALT: 31 U/L (ref 0–35)
AST: 26 U/L (ref 0–37)
Alkaline Phosphatase: 55 U/L (ref 39–117)
BILIRUBIN TOTAL: 0.4 mg/dL (ref 0.2–1.2)
BUN: 8 mg/dL (ref 6–23)
CALCIUM: 9.3 mg/dL (ref 8.4–10.5)
CO2: 30 meq/L (ref 19–32)
CREATININE: 0.78 mg/dL (ref 0.40–1.20)
Chloride: 105 mEq/L (ref 96–112)
GFR: 83.63 mL/min (ref >60.00)
GLUCOSE: 87 mg/dL (ref 70–99)
Potassium: 4.2 mEq/L (ref 3.5–5.1)
Sodium: 140 mEq/L (ref 135–145)
TOTAL PROTEIN: 7.4 g/dL (ref 6.0–8.3)

## 2015-11-24 LAB — LIPID PANEL
CHOL/HDL RATIO: 5
Cholesterol: 246 mg/dL — ABNORMAL HIGH (ref 0–200)
HDL: 46.7 mg/dL (ref >39.00)
NONHDL: 199.23
Triglycerides: 203 mg/dL — ABNORMAL HIGH (ref 0.0–149.0)
VLDL: 40.6 mg/dL — ABNORMAL HIGH (ref 0.0–40.0)

## 2015-11-24 LAB — CBC WITH DIFFERENTIAL/PLATELET
BASOS ABS: 0.1 10*3/uL (ref 0.0–0.1)
Basophils Relative: 0.7 % (ref 0.0–3.0)
EOS PCT: 1.9 % (ref 0.0–5.0)
Eosinophils Absolute: 0.1 10*3/uL (ref 0.0–0.7)
HEMATOCRIT: 39.2 % (ref 36.0–46.0)
HEMOGLOBIN: 13.4 g/dL (ref 12.0–15.0)
LYMPHS ABS: 2.4 10*3/uL (ref 0.7–4.0)
LYMPHS PCT: 31.8 % (ref 12.0–46.0)
MCHC: 34.2 g/dL (ref 30.0–36.0)
MCV: 89.8 fl (ref 78.0–100.0)
MONOS PCT: 6.2 % (ref 3.0–12.0)
Monocytes Absolute: 0.5 10*3/uL (ref 0.1–1.0)
Neutro Abs: 4.6 10*3/uL (ref 1.4–7.7)
Neutrophils Relative %: 59.4 % (ref 43.0–77.0)
Platelets: 337 10*3/uL (ref 150.0–400.0)
RBC: 4.36 Mil/uL (ref 3.87–5.11)
RDW: 13.2 % (ref 11.5–15.5)
WBC: 7.7 10*3/uL (ref 4.0–10.5)

## 2015-11-24 LAB — LDL CHOLESTEROL, DIRECT: Direct LDL: 152 mg/dL

## 2015-11-24 MED ORDER — LEVOTHYROXINE SODIUM 100 MCG PO TABS: ORAL_TABLET | ORAL | 3 refills | Status: DC

## 2015-11-24 MED ORDER — CARBAMIDE PEROXIDE 6.5 % OT SOLN: OTIC | 0 refills | Status: DC

## 2015-11-24 MED ORDER — PRAVASTATIN SODIUM 10 MG PO TABS: 10.0000 mg | ORAL_TABLET | Freq: Every day | ORAL | 3 refills | Status: DC

## 2015-11-24 MED ORDER — TRAZODONE HCL 50 MG PO TABS: ORAL_TABLET | ORAL | 2 refills | Status: DC

## 2015-11-24 NOTE — Progress Notes (Addendum)
[name], female   DOB: 1965/06/24, 50 y.o.   MRN: [id number]   Subjective:    [name], female    DOB: February 11, 1966, 50 y.o.   MRN: [id number]  HPI  Patient here for her physical exam.   She reports she is doing well.  Tries to stay active.  No cardiac symptoms with increased activity or exertion.  No sob.  No acid reflux.  No abdominal pain or cramping.  Bowels stable.  Occasional hot flash.  Feels has build up in her ear.  Ear fullness.   Right ear.  No pain.  She also reports that her father was just recently diagnosed with heart disease and her brother also diagnosed.  She has no cardiac symptoms such as chest pain or sob with exertion.     Past Medical History:  Diagnosis Date  . GERD (gastroesophageal reflux disease)   . Hypertension    Past Surgical History:  Procedure Laterality Date  . ABDOMINAL HYSTERECTOMY  2011  . BREAST BIOPSY Left 12/26/2007   Dr Evette Cristal  . CHOLECYSTECTOMY  03/2012   Dr Malissa Hippo  . UPPER GI ENDOSCOPY     upper Endoscopy (10-15-2010)    Family History  Problem Relation Age of Onset  . Hypertension Mother   . Breast cancer Mother 64    negative genetic testing  . Heart block Father   . Heart disease Maternal Grandmother   . Stroke Maternal Grandmother   . Hypertension Maternal Grandmother   . Heart disease Maternal Grandfather   . Stroke Maternal Grandfather   . Hypertension Maternal Grandfather   . Diabetes Paternal Grandfather    Social History   Social History  . Marital status: Married    Spouse name: Jonny Ruiz  . Number of children: 2  . Years of education: N/A   Occupational History  .  [employer]   Social History Main Topics  . Smoking status: Former Games developer  . Smokeless tobacco: Never Used     Comment: smoked in college  . Alcohol use No  . Drug use: No  . Sexual activity: Not Asked   Other Topics Concern  . None   Social History Narrative  . None    Outpatient Encounter  Prescriptions as of 11/24/2015  Medication Sig  . levothyroxine (SYNTHROID, LEVOTHROID) 100 MCG tablet TAKE 1 TABLET (100 MCG TOTAL) BY MOUTH DAILY.  Marland Kitchen omeprazole (PRILOSEC) 20 MG capsule Take 20 mg by mouth daily.  . traZODone (DESYREL) 50 MG tablet Take two tablets q hs  . [DISCONTINUED] levothyroxine (SYNTHROID, LEVOTHROID) 100 MCG tablet TAKE 1 TABLET (100 MCG TOTAL) BY MOUTH DAILY.  . [DISCONTINUED] oseltamivir (TAMIFLU) 75 MG capsule Take 1 capsule (75 mg total) by mouth daily.  . [DISCONTINUED] pantoprazole (PROTONIX) 40 MG tablet TAKE ONE TABLET BY MOUTH TWICE DAILY (Patient taking differently: TAKE ONE TABLET BY MOUTH TWICE DAILY AS NEEDED)  . [DISCONTINUED] pravastatin (PRAVACHOL) 10 MG tablet Take 1 tablet (10 mg total) by mouth daily.  . [DISCONTINUED] pravastatin (PRAVACHOL) 10 MG tablet Take 1 tablet (10 mg total) by mouth daily.  . [DISCONTINUED] traZODone (DESYREL) 50 MG tablet TAKE 1&1/2 TABLETS BY MOUTH AT BEDTIME  . carbamide peroxide (DEBROX) 6.5 % otic solution 3-4 drops in right ear q day.  Massage for 5 minutes.  . [DISCONTINUED] azithromycin (ZITHROMAX) 250 MG tablet Take two tablets x 1 day and then one tablet per day for four more days.   No facility-administered  encounter medications on file as of 11/24/2015.     Review of Systems  Constitutional: Negative for appetite change and unexpected weight change.  HENT: Negative for congestion and sinus pressure.   Eyes: Negative for pain and visual disturbance.  Respiratory: Negative for cough, chest tightness and shortness of breath.   Cardiovascular: Negative for chest pain, palpitations and leg swelling.  Gastrointestinal: Negative for abdominal pain, diarrhea, nausea and vomiting.  Genitourinary: Negative for difficulty urinating and dysuria.  Musculoskeletal: Negative for back pain and joint swelling.  Skin: Negative for color change and rash.  Neurological: Negative for dizziness, light-headedness and headaches.    Hematological: Negative for adenopathy. Does not bruise/bleed easily.  Psychiatric/Behavioral: Negative for agitation and dysphoric mood.       Objective:    Physical Exam  Constitutional: She is oriented to person, place, and time. She appears well-developed and well-nourished. No distress.  HENT:  Head: Normocephalic and atraumatic.  Nose: Nose normal.  Mouth/Throat: Oropharynx is clear and moist. No oropharyngeal exudate.  Right ear - cerumen impaction.   Eyes: Conjunctivae are normal. Right eye exhibits no discharge. Left eye exhibits no discharge.  Neck: Neck supple. No thyromegaly present.  Cardiovascular: Normal rate and regular rhythm.   Pulmonary/Chest: Effort normal and breath sounds normal. No respiratory distress.  Breasts exam - no nipple discharge or nipple retraction present.  Left breast fullness/nodule.  No axillary adenopathy.    Abdominal: Soft. Bowel sounds are normal. There is no tenderness.  Musculoskeletal: She exhibits no edema or tenderness.  Lymphadenopathy:    She has no cervical adenopathy.  Neurological: She is alert and oriented to person, place, and time.  Skin: No rash noted. No erythema.  Psychiatric: She has a normal mood and affect. Her behavior is normal.    BP 132/82 (BP Location: Right Arm, Patient Position: Sitting, Cuff Size: Normal)   Pulse 68   Temp 97.8 F (36.6 C) (Oral)   Resp 16   Ht 5' 1.75" (1.568 m)   Wt 139 lb (63 kg)   LMP 06/26/2009   BMI 25.63 kg/m  Wt Readings from Last 3 Encounters:  11/24/15 139 lb (63 kg)  04/23/15 138 lb 2 oz (62.7 kg)  11/10/14 133 lb 3.2 oz (60.4 kg)     Lab Results  Component Value Date   WBC 7.7 11/24/2015   HGB 13.4 11/24/2015   HCT 39.2 11/24/2015   PLT 337.0 11/24/2015   GLUCOSE 87 11/24/2015   CHOL 246 (H) 11/24/2015   TRIG 203.0 (H) 11/24/2015   HDL 46.70 11/24/2015   LDLDIRECT 152.0 11/24/2015   LDLCALC 171 (H) 09/29/2014   ALT 31 11/24/2015   AST 26 11/24/2015   NA 140  11/24/2015   K 4.2 11/24/2015   CL 105 11/24/2015   CREATININE 0.78 11/24/2015   BUN 8 11/24/2015   CO2 30 11/24/2015   TSH 1.05 11/24/2015    [place] Breast Ltd Uni Left Inc Axilla  Result Date: 09/29/2014 CLINICAL DATA:  50 year old female, callback from screening mammogram for possible left breast mass. EXAM: DIGITAL DIAGNOSTIC LEFT MAMMOGRAM WITH 3D TOMOSYNTHESIS WITH CAD ULTRASOUND LEFT BREAST COMPARISON:  09/23/2014, additional prior studies dating back to 06/13/2007 ACR Breast Density Category c: The breast tissue is heterogeneously dense, which may obscure small masses. FINDINGS: CC and MLO spot-compression views of the left breast with tomosynthesis were performed. On the additional views, there is a large, oval circumscribed mass within the left breast at 3 o'clock. No additional abnormality is  identified. Mammographic images were processed with CAD. Targeted ultrasound is performed showing a large cyst at 3 o'clock, 1 cm from the nipple measuring 5.1 x 5.5 x 2.1 cm containing a single thin septation and internal debris. No solid component or internal vascularity is identified. The patient reports mild associated tenderness with the cyst. IMPRESSION: Left breast cyst. RECOMMENDATION: 1. No mammographic or sonographic evidence of malignancy. The patient wishes to pursue cyst aspiration due to the associated tenderness. 2.  Screening mammogram in one year.(Code:SM-B-01Y) I have discussed the findings and recommendations with the patient. Results were also provided in writing at the conclusion of the visit. If applicable, a reminder letter will be sent to the patient regarding the next appointment. BI-RADS CATEGORY  2: Benign. Electronically Signed   By: Dalphine Handing M.D.   On: 09/29/2014 11:41   Mm Diag Breast Tomo Uni Left  Result Date: 09/29/2014 CLINICAL DATA:  50 year old female, callback from screening mammogram for possible left breast mass. EXAM: DIGITAL DIAGNOSTIC LEFT MAMMOGRAM WITH 3D  TOMOSYNTHESIS WITH CAD ULTRASOUND LEFT BREAST COMPARISON:  09/23/2014, additional prior studies dating back to 06/13/2007 ACR Breast Density Category c: The breast tissue is heterogeneously dense, which may obscure small masses. FINDINGS: CC and MLO spot-compression views of the left breast with tomosynthesis were performed. On the additional views, there is a large, oval circumscribed mass within the left breast at 3 o'clock. No additional abnormality is identified. Mammographic images were processed with CAD. Targeted ultrasound is performed showing a large cyst at 3 o'clock, 1 cm from the nipple measuring 5.1 x 5.5 x 2.1 cm containing a single thin septation and internal debris. No solid component or internal vascularity is identified. The patient reports mild associated tenderness with the cyst. IMPRESSION: Left breast cyst. RECOMMENDATION: 1. No mammographic or sonographic evidence of malignancy. The patient wishes to pursue cyst aspiration due to the associated tenderness. 2.  Screening mammogram in one year.(Code:SM-B-01Y) I have discussed the findings and recommendations with the patient. Results were also provided in writing at the conclusion of the visit. If applicable, a reminder letter will be sent to the patient regarding the next appointment. BI-RADS CATEGORY  2: Benign. Electronically Signed   By: Dalphine Handing M.D.   On: 09/29/2014 11:41       Assessment & Plan:   Problem List Items Addressed This Visit    Family history of early CAD    Brother had father recently diagnosed with CAD.  EKG obtained and revealed SR with no acute ischemic changes.  Currently asymptomatic.  Discussed cardiology referral.  She declines at this time.  Follow.  Continue risk factor modification.        Relevant Orders   EKG 12-Lead (Completed)   GERD (gastroesophageal reflux disease)    On omeprazole.  Acid reflux controlled.        Relevant Medications   omeprazole (PRILOSEC) 20 MG capsule   Health care  maintenance    Physical today 11/24/15.  PAP 09/02/12 negative with negative HPV.  She is s/p hysterectomy.  No pap today.  She wants to do pap at age 58.  Discussed recommendations.        Hypercholesterolemia    Low cholesterol diet and exercise.  Follow lipid panel and liver function tests.  On pravastatin now.  Discussed being more strict about modifying risk factors.  May need to change to crestor.        Relevant Orders   CBC with Differential/Platelet (Completed)  Comprehensive metabolic panel (Completed)   Lipid panel (Completed)   LDL cholesterol, direct (Completed)   Hypothyroidism    On thyroid replacement.  Follow tsh.        Relevant Medications   levothyroxine (SYNTHROID, LEVOTHROID) 100 MCG tablet   Other Relevant Orders   TSH (Completed)   Sleep disturbance    On trazodone.  Follow.        Stress    Sleeping well.  On trazodone.  Doing well.  Follow.        Other Visit Diagnoses    Routine general medical examination at a health care facility    -  Primary   Cerumen impaction, right       debrox as directed.  return for ear irrigation.         Dale Langhorne, MD

## 2015-11-24 NOTE — Assessment & Plan Note (Signed)
Physical today 11/24/15.  PAP 09/02/12 negative with negative HPV.  She is s/p hysterectomy.  No pap today.  She wants to do pap at age 50.  Discussed recommendations.

## 2015-11-25 ENCOUNTER — Telehealth: Payer: Self-pay

## 2015-11-25 DIAGNOSIS — E7800 Pure hypercholesterolemia, unspecified: Secondary | ICD-10-CM

## 2015-11-25 MED ORDER — ROSUVASTATIN CALCIUM 10 MG PO TABS: 10.0000 mg | ORAL_TABLET | Freq: Every day | ORAL | 3 refills | Status: DC

## 2015-11-25 NOTE — Telephone Encounter (Signed)
Advised pt of lab results. Pt verbally acknowledges understanding. Sent in Crestor and scheduled lab appointment. Allene DillonEmily Drozdowski, CMA

## 2015-11-25 NOTE — Telephone Encounter (Signed)
-----   Message from Acey Lavanya M Roman, RN sent at 11/25/2015  9:51 AM EDT -----   ----- Message ----- From: Dale Durhamharlene Scott, MD Sent: 11/25/2015   4:41 AM To: Warden FillersLatoya S Wright, CMA  Please call and notify pt that her cholesterol is still elevated above goal.  Triglycerides have increased as well.  I would like to change her cholesterol medication, given her family history.  I would like to stop pravastatin and have her start crestor 10mg  q day.  Will need liver panel checked 6 weeks after starting the medication.  Please schedule non fasting lab appt.  Hgb, thyroid test, kidney function tests and liver function tests are wnl.

## 2015-11-29 ENCOUNTER — Encounter: Payer: Self-pay | Admitting: Internal Medicine

## 2015-11-29 DIAGNOSIS — Z8249 Family history of ischemic heart disease and other diseases of the circulatory system: Secondary | ICD-10-CM | POA: Insufficient documentation

## 2015-11-29 NOTE — Assessment & Plan Note (Signed)
Sleeping well.  On trazodone.  Doing well.  Follow.

## 2015-11-29 NOTE — Assessment & Plan Note (Addendum)
Low cholesterol diet and exercise.  Follow lipid panel and liver function tests.  On pravastatin now.  Discussed being more strict about modifying risk factors.  May need to change to crestor.

## 2015-11-29 NOTE — Assessment & Plan Note (Signed)
On thyroid replacement.  Follow tsh.  

## 2015-11-29 NOTE — Assessment & Plan Note (Signed)
On trazodone.  Follow.  

## 2015-11-29 NOTE — Assessment & Plan Note (Signed)
On omeprazole.  Acid reflux controlled.  °

## 2015-11-29 NOTE — Assessment & Plan Note (Signed)
Brother had father recently diagnosed with CAD.  EKG obtained and revealed SR with no acute ischemic changes.  Currently asymptomatic.  Discussed cardiology referral.  She declines at this time.  Follow.  Continue risk factor modification.

## 2015-11-30 ENCOUNTER — Ambulatory Visit (INDEPENDENT_AMBULATORY_CARE_PROVIDER_SITE_OTHER): Payer: 59

## 2015-11-30 DIAGNOSIS — H6121 Impacted cerumen, right ear: Secondary | ICD-10-CM | POA: Diagnosis not present

## 2015-11-30 NOTE — Progress Notes (Addendum)
Pt was in today for a ear cleaning. Pt had some cerumen impaction of the right ear. Pt had some irritation behind the impaction.  Pt was using debrox since 11/24/15. Pt's left ear was looked at and no impaction of that ear.PCP told pt in office visit on 11/24/15 to schedule nurse visit for ear irrigation. Pt tolerated well.  Reviewed above information.  Agree.    Dr Lorin PicketScott

## 2015-12-07 ENCOUNTER — Telehealth: Payer: Self-pay | Admitting: Internal Medicine

## 2015-12-07 NOTE — Telephone Encounter (Signed)
Please advise, thanks.

## 2015-12-07 NOTE — Telephone Encounter (Signed)
This is the patient we discussed.  Can you call her?  Thanks

## 2015-12-07 NOTE — Telephone Encounter (Signed)
lvm for pt- Need to know which breast- Clock location of mass- [place] or [facility]

## 2015-12-07 NOTE — Telephone Encounter (Signed)
A knot was notice on the patient's breast at routine exam on 8.30.17 per the patient. The patient was informed that a mammogram would be scheduled for her, but there is no order for a mammogram in the system . The patient wants to be scheduled next week.

## 2015-12-13 ENCOUNTER — Telehealth: Payer: Self-pay | Admitting: *Deleted

## 2015-12-13 NOTE — Telephone Encounter (Signed)
Spoke with patient needs to have diagnostic, knot in Left breast.  [place]. Please advise, thanks

## 2015-12-13 NOTE — Telephone Encounter (Signed)
Left a message to speak with the patient.

## 2015-12-13 NOTE — Telephone Encounter (Signed)
Pt returned call today at [phone]

## 2015-12-13 NOTE — Telephone Encounter (Signed)
See additional telephone note with details. thanks

## 2015-12-13 NOTE — Telephone Encounter (Signed)
Patient requested an update on her diagnostic mammogram order, dur to a current knot  Pt contact [phone]

## 2015-12-14 ENCOUNTER — Other Ambulatory Visit: Payer: Self-pay | Admitting: Internal Medicine

## 2015-12-14 DIAGNOSIS — N63 Unspecified lump in unspecified breast: Secondary | ICD-10-CM

## 2015-12-14 NOTE — Telephone Encounter (Signed)
Orders placed.

## 2015-12-14 NOTE — Telephone Encounter (Signed)
Orders placed.  Please schedule.  Thanks

## 2015-12-28 ENCOUNTER — Ambulatory Visit
Admission: RE | Admit: 2015-12-28 | Discharge: 2015-12-28 | Disposition: A | Discharge status: Home / Self Care | Payer: 59 | Source: Ambulatory Visit | Attending: Internal Medicine | Admitting: Internal Medicine

## 2015-12-28 ENCOUNTER — Other Ambulatory Visit: Payer: Self-pay | Admitting: Internal Medicine

## 2015-12-28 DIAGNOSIS — N63 Unspecified lump in unspecified breast: Secondary | ICD-10-CM

## 2015-12-28 DIAGNOSIS — N6002 Solitary cyst of left breast: Secondary | ICD-10-CM | POA: Diagnosis not present

## 2015-12-31 ENCOUNTER — Encounter: Payer: Self-pay | Admitting: Internal Medicine

## 2016-01-04 ENCOUNTER — Telehealth: Payer: Self-pay

## 2016-01-04 DIAGNOSIS — E7800 Pure hypercholesterolemia, unspecified: Secondary | ICD-10-CM

## 2016-01-04 NOTE — Telephone Encounter (Signed)
Pt coming for labs 01/06/16. Looks like for possible liver function test. Please place future order. Thank you.

## 2016-01-04 NOTE — Telephone Encounter (Signed)
Order placed for f/u liver panel.  

## 2016-01-06 ENCOUNTER — Other Ambulatory Visit (INDEPENDENT_AMBULATORY_CARE_PROVIDER_SITE_OTHER): Payer: 59

## 2016-01-06 DIAGNOSIS — E7800 Pure hypercholesterolemia, unspecified: Secondary | ICD-10-CM | POA: Diagnosis not present

## 2016-01-06 LAB — HEPATIC FUNCTION PANEL
ALT: 130 U/L — ABNORMAL HIGH (ref 0–35)
AST: 86 U/L — ABNORMAL HIGH (ref 0–37)
Albumin: 4.2 g/dL (ref 3.5–5.2)
Alkaline Phosphatase: 69 U/L (ref 39–117)
BILIRUBIN DIRECT: 0.1 mg/dL (ref 0.0–0.3)
BILIRUBIN TOTAL: 0.3 mg/dL (ref 0.2–1.2)
Total Protein: 6.8 g/dL (ref 6.0–8.3)

## 2016-01-07 ENCOUNTER — Other Ambulatory Visit: Payer: Self-pay | Admitting: Internal Medicine

## 2016-01-07 DIAGNOSIS — R945 Abnormal results of liver function studies: Secondary | ICD-10-CM

## 2016-01-07 DIAGNOSIS — R7989 Other specified abnormal findings of blood chemistry: Secondary | ICD-10-CM

## 2016-01-07 NOTE — Progress Notes (Signed)
Order placed for f/u liver panel.  

## 2016-01-12 ENCOUNTER — Ambulatory Visit
Admission: RE | Admit: 2016-01-12 | Discharge: 2016-01-12 | Disposition: A | Discharge status: Home / Self Care | Payer: 59 | Source: Ambulatory Visit | Attending: Internal Medicine | Admitting: Internal Medicine

## 2016-01-12 ENCOUNTER — Encounter: Payer: Self-pay | Admitting: Internal Medicine

## 2016-01-12 DIAGNOSIS — N6002 Solitary cyst of left breast: Secondary | ICD-10-CM | POA: Diagnosis present

## 2016-01-14 ENCOUNTER — Other Ambulatory Visit (INDEPENDENT_AMBULATORY_CARE_PROVIDER_SITE_OTHER): Payer: 59

## 2016-01-14 DIAGNOSIS — R7989 Other specified abnormal findings of blood chemistry: Secondary | ICD-10-CM | POA: Diagnosis not present

## 2016-01-14 DIAGNOSIS — R945 Abnormal results of liver function studies: Secondary | ICD-10-CM

## 2016-01-14 LAB — HEPATIC FUNCTION PANEL
ALT: 51 U/L — ABNORMAL HIGH (ref 0–35)
AST: 26 U/L (ref 0–37)
Albumin: 4.9 g/dL (ref 3.5–5.2)
Alkaline Phosphatase: 75 U/L (ref 39–117)
BILIRUBIN DIRECT: 0.1 mg/dL (ref 0.0–0.3)
TOTAL PROTEIN: 7.6 g/dL (ref 6.0–8.3)
Total Bilirubin: 0.4 mg/dL (ref 0.2–1.2)

## 2016-01-14 NOTE — Telephone Encounter (Signed)
Please call and notify pt that I reviewed her my chart message.  Is she is having increased pain in head and neck with increased antiinflammatory use - needs to be evaluated.  Given no better with medication, I recommend her going ahead and being evaluated today - to confirm nothing more acute and then we can follow up after.  To [facility] or [place] [facility].

## 2016-01-14 NOTE — Telephone Encounter (Signed)
Thank you for calling pt.  Please try one more time before leaving today.  If unable to reach, leave her another message to go ahead and be seen.  Can f/u with us after.  Thanks   Dr Lorin PicketScott

## 2016-01-14 NOTE — Telephone Encounter (Signed)
I spoke with the patient and she has had the pain for 2-3 months, normally it is in her neck and lower part of the head and normally Advil will make it better.  She got concerned when she was sleeping last night and woke up to a pain in her neck that radiated to her entire head.  She took 4 advil and went back to sleep and then this afternoon took an additional 3 advil and feels ok, just sore.  I explained that you wanted her to be seen acutely today and she is requesting an appt with you next week as it is friday?  Please advise, thanks

## 2016-01-17 ENCOUNTER — Encounter: Payer: Self-pay | Admitting: Internal Medicine

## 2016-01-20 NOTE — Telephone Encounter (Signed)
Spoke with the patient, expressed PCP concerns and that she would like to have her seen today to be evaluated to see if referral is needed.  Patient initially reluctant and then agreed to appt with another Provider tomorrow at 315pm.  Thanks   Asher MuirJamie this patient is on your schedule tomorrow. thanks

## 2016-01-20 NOTE — Telephone Encounter (Signed)
Please let her know that I was not in the office yesterday pm.  Given her increased pain, I do think she needs to go ahead and be seen.  The original message was at the end of last week.  I did not receive this message until now.  I would like for her to be seen today.  Thanks    Dr Lorin Picket

## 2016-01-21 ENCOUNTER — Encounter: Payer: Self-pay | Admitting: Family Medicine

## 2016-01-21 ENCOUNTER — Ambulatory Visit (INDEPENDENT_AMBULATORY_CARE_PROVIDER_SITE_OTHER): Payer: 59 | Admitting: Family Medicine

## 2016-01-21 DIAGNOSIS — S46812A Strain of other muscles, fascia and tendons at shoulder and upper arm level, left arm, initial encounter: Secondary | ICD-10-CM

## 2016-01-21 DIAGNOSIS — S46819A Strain of other muscles, fascia and tendons at shoulder and upper arm level, unspecified arm, initial encounter: Secondary | ICD-10-CM | POA: Insufficient documentation

## 2016-01-21 MED ORDER — CYCLOBENZAPRINE HCL 10 MG PO TABS: 10.0000 mg | ORAL_TABLET | Freq: Three times a day (TID) | ORAL | 0 refills | Status: DC | PRN

## 2016-01-21 NOTE — Patient Instructions (Signed)
Nice to meet you. You likely have a strained muscle in your neck or trapezius.  We will treat this with ibuprofen 800 mg every 8 hours as needed. You should take this with food. We will additionally treat this with a muscle relaxer called Flexeril. This may make you drowsy so be wary when you take this. You should do the exercises as follows. If your symptoms do not improve please let us know and we could consider an x-ray or physical therapy. If you develop numbness, weakness, worsening pain, or any new or changing symptoms please seek medical attention medially.   Cervical Strain and Sprain With Rehab Cervical strain and sprain are injuries that commonly occur with "whiplash" injuries. Whiplash occurs when the neck is forcefully whipped backward or forward, such as during a motor vehicle accident or during contact sports. The muscles, ligaments, tendons, discs, and nerves of the neck are susceptible to injury when this occurs. RISK FACTORS Risk of having a whiplash injury increases if:  Osteoarthritis of the spine.  Situations that make head or neck accidents or trauma more likely.  High-risk sports (football, rugby, wrestling, hockey, auto racing, gymnastics, diving, contact karate, or boxing).  Poor strength and flexibility of the neck.  Previous neck injury.  Poor tackling technique.  Improperly fitted or padded equipment. SYMPTOMS   Pain or stiffness in the front or back of neck or both.  Symptoms may present immediately or up to 24 hours after injury.  Dizziness, headache, nausea, and vomiting.  Muscle spasm with soreness and stiffness in the neck.  Tenderness and swelling at the injury site. PREVENTION  Learn and use proper technique (avoid tackling with the head, spearing, and head-butting; use proper falling techniques to avoid landing on the head).  Warm up and stretch properly before activity.  Maintain physical fitness:  Strength, flexibility, and  endurance.  Cardiovascular fitness.  Wear properly fitted and padded protective equipment, such as padded soft collars, for participation in contact sports. PROGNOSIS  Recovery from cervical strain and sprain injuries is dependent on the extent of the injury. These injuries are usually curable in 1 week to 3 months with appropriate treatment.  RELATED COMPLICATIONS   Temporary numbness and weakness may occur if the nerve roots are damaged, and this may persist until the nerve has completely healed.  Chronic pain due to frequent recurrence of symptoms.  Prolonged healing, especially if activity is resumed too soon (before complete recovery). TREATMENT  Treatment initially involves the use of ice and medication to help reduce pain and inflammation. It is also important to perform strengthening and stretching exercises and modify activities that worsen symptoms so the injury does not get worse. These exercises may be performed at home or with a therapist. For patients who experience severe symptoms, a soft, padded collar may be recommended to be worn around the neck.  Improving your posture may help reduce symptoms. Posture improvement includes pulling your chin and abdomen in while sitting or standing. If you are sitting, sit in a firm chair with your buttocks against the back of the chair. While sleeping, try replacing your pillow with a small towel rolled to 2 inches in diameter, or use a cervical pillow or soft cervical collar. Poor sleeping positions delay healing.  For patients with nerve root damage, which causes numbness or weakness, the use of a cervical traction apparatus may be recommended. Surgery is rarely necessary for these injuries. However, cervical strain and sprains that are present at birth (congenital) may  require surgery. MEDICATION   If pain medication is necessary, nonsteroidal anti-inflammatory medications, such as aspirin and ibuprofen, or other minor pain relievers, such  as acetaminophen, are often recommended.  Do not take pain medication for 7 days before surgery.  Prescription pain relievers may be given if deemed necessary by your caregiver. Use only as directed and only as much as you need. HEAT AND COLD:   Cold treatment (icing) relieves pain and reduces inflammation. Cold treatment should be applied for 10 to 15 minutes every 2 to 3 hours for inflammation and pain and immediately after any activity that aggravates your symptoms. Use ice packs or an ice massage.  Heat treatment may be used prior to performing the stretching and strengthening activities prescribed by your caregiver, physical therapist, or athletic trainer. Use a heat pack or a warm soak. SEEK MEDICAL CARE IF:   Symptoms get worse or do not improve in 2 weeks despite treatment.  New, unexplained symptoms develop (drugs used in treatment may produce side effects). EXERCISES RANGE OF MOTION (ROM) AND STRETCHING EXERCISES - Cervical Strain and Sprain These exercises may help you when beginning to rehabilitate your injury. In order to successfully resolve your symptoms, you must improve your posture. These exercises are designed to help reduce the forward-head and rounded-shoulder posture which contributes to this condition. Your symptoms may resolve with or without further involvement from your physician, physical therapist or athletic trainer. While completing these exercises, remember:   Restoring tissue flexibility helps normal motion to return to the joints. This allows healthier, less painful movement and activity.  An effective stretch should be held for at least 20 seconds, although you may need to begin with shorter hold times for comfort.  A stretch should never be painful. You should only feel a gentle lengthening or release in the stretched tissue. STRETCH- Axial Extensors  Lie on your back on the floor. You may bend your knees for comfort. Place a rolled-up hand towel or dish  towel, about 2 inches in diameter, under the part of your head that makes contact with the floor.  Gently tuck your chin, as if trying to make a "double chin," until you feel a gentle stretch at the base of your head.  Hold __________ seconds. Repeat __________ times. Complete this exercise __________ times per day.  STRETCH - Axial Extension   Stand or sit on a firm surface. Assume a good posture: chest up, shoulders drawn back, abdominal muscles slightly tense, knees unlocked (if standing) and feet hip width apart.  Slowly retract your chin so your head slides back and your chin slightly lowers. Continue to look straight ahead.  You should feel a gentle stretch in the back of your head. Be certain not to feel an aggressive stretch since this can cause headaches later.  Hold for __________ seconds. Repeat __________ times. Complete this exercise __________ times per day. STRETCH - Cervical Side Bend   Stand or sit on a firm surface. Assume a good posture: chest up, shoulders drawn back, abdominal muscles slightly tense, knees unlocked (if standing) and feet hip width apart.  Without letting your nose or shoulders move, slowly tip your right / left ear to your shoulder until your feel a gentle stretch in the muscles on the opposite side of your neck.  Hold __________ seconds. Repeat __________ times. Complete this exercise __________ times per day. STRETCH - Cervical Rotators   Stand or sit on a firm surface. Assume a good posture: chest up, shoulders  drawn back, abdominal muscles slightly tense, knees unlocked (if standing) and feet hip width apart.  Keeping your eyes level with the ground, slowly turn your head until you feel a gentle stretch along the back and opposite side of your neck.  Hold __________ seconds. Repeat __________ times. Complete this exercise __________ times per day. RANGE OF MOTION - Neck Circles   Stand or sit on a firm surface. Assume a good posture: chest  up, shoulders drawn back, abdominal muscles slightly tense, knees unlocked (if standing) and feet hip width apart.  Gently roll your head down and around from the back of one shoulder to the back of the other. The motion should never be forced or painful.  Repeat the motion 10-20 times, or until you feel the neck muscles relax and loosen. Repeat __________ times. Complete the exercise __________ times per day. STRENGTHENING EXERCISES - Cervical Strain and Sprain These exercises may help you when beginning to rehabilitate your injury. They may resolve your symptoms with or without further involvement from your physician, physical therapist, or athletic trainer. While completing these exercises, remember:   Muscles can gain both the endurance and the strength needed for everyday activities through controlled exercises.  Complete these exercises as instructed by your physician, physical therapist, or athletic trainer. Progress the resistance and repetitions only as guided.  You may experience muscle soreness or fatigue, but the pain or discomfort you are trying to eliminate should never worsen during these exercises. If this pain does worsen, stop and make certain you are following the directions exactly. If the pain is still present after adjustments, discontinue the exercise until you can discuss the trouble with your clinician. STRENGTH - Cervical Flexors, Isometric  Face a wall, standing about 6 inches away. Place a small pillow, a ball about 6-8 inches in diameter, or a folded towel between your forehead and the wall.  Slightly tuck your chin and gently push your forehead into the soft object. Push only with mild to moderate intensity, building up tension gradually. Keep your jaw and forehead relaxed.  Hold 10 to 20 seconds. Keep your breathing relaxed.  Release the tension slowly. Relax your neck muscles completely before you start the next repetition. Repeat __________ times. Complete this  exercise __________ times per day. STRENGTH- Cervical Lateral Flexors, Isometric   Stand about 6 inches away from a wall. Place a small pillow, a ball about 6-8 inches in diameter, or a folded towel between the side of your head and the wall.  Slightly tuck your chin and gently tilt your head into the soft object. Push only with mild to moderate intensity, building up tension gradually. Keep your jaw and forehead relaxed.  Hold 10 to 20 seconds. Keep your breathing relaxed.  Release the tension slowly. Relax your neck muscles completely before you start the next repetition. Repeat __________ times. Complete this exercise __________ times per day. STRENGTH - Cervical Extensors, Isometric   Stand about 6 inches away from a wall. Place a small pillow, a ball about 6-8 inches in diameter, or a folded towel between the back of your head and the wall.  Slightly tuck your chin and gently tilt your head back into the soft object. Push only with mild to moderate intensity, building up tension gradually. Keep your jaw and forehead relaxed.  Hold 10 to 20 seconds. Keep your breathing relaxed.  Release the tension slowly. Relax your neck muscles completely before you start the next repetition. Repeat __________ times. Complete this exercise  __________ times per day. POSTURE AND BODY MECHANICS CONSIDERATIONS - Cervical Strain and Sprain Keeping correct posture when sitting, standing or completing your activities will reduce the stress put on different body tissues, allowing injured tissues a chance to heal and limiting painful experiences. The following are general guidelines for improved posture. Your physician or physical therapist will provide you with any instructions specific to your needs. While reading these guidelines, remember:  The exercises prescribed by your provider will help you have the flexibility and strength to maintain correct postures.  The correct posture provides the optimal  environment for your joints to work. All of your joints have less wear and tear when properly supported by a spine with good posture. This means you will experience a healthier, less painful body.  Correct posture must be practiced with all of your activities, especially prolonged sitting and standing. Correct posture is as important when doing repetitive low-stress activities (typing) as it is when doing a single heavy-load activity (lifting). PROLONGED STANDING WHILE SLIGHTLY LEANING FORWARD When completing a task that requires you to lean forward while standing in one place for a long time, place either foot up on a stationary 2- to 4-inch high object to help maintain the best posture. When both feet are on the ground, the low back tends to lose its slight inward curve. If this curve flattens (or becomes too large), then the back and your other joints will experience too much stress, fatigue more quickly, and can cause pain.  RESTING POSITIONS Consider which positions are most painful for you when choosing a resting position. If you have pain with flexion-based activities (sitting, bending, stooping, squatting), choose a position that allows you to rest in a less flexed posture. You would want to avoid curling into a fetal position on your side. If your pain worsens with extension-based activities (prolonged standing, working overhead), avoid resting in an extended position such as sleeping on your stomach. Most people will find more comfort when they rest with their spine in a more neutral position, neither too rounded nor too arched. Lying on a non-sagging bed on your side with a pillow between your knees, or on your back with a pillow under your knees will often provide some relief. Keep in mind, being in any one position for a prolonged period of time, no matter how correct your posture, can still lead to stiffness. WALKING Walk with an upright posture. Your ears, shoulders, and hips should all line  up. OFFICE WORK When working at a desk, create an environment that supports good, upright posture. Without extra support, muscles fatigue and lead to excessive strain on joints and other tissues. CHAIR:  A chair should be able to slide under your desk when your back makes contact with the back of the chair. This allows you to work closely.  The chair's height should allow your eyes to be level with the upper part of your monitor and your hands to be slightly lower than your elbows.  Body position:  Your feet should make contact with the floor. If this is not possible, use a foot rest.  Keep your ears over your shoulders. This will reduce stress on your neck and low back.   This information is not intended to replace advice given to you by your health care provider. Make sure you discuss any questions you have with your health care provider.   Document Released: 03/13/2005 Document Revised: 04/03/2014 Document Reviewed: 06/25/2008 Elsevier Interactive Patient Education [employer]

## 2016-01-21 NOTE — Progress Notes (Signed)
Pre visit review using our clinic review tool, if applicable. No additional management support is needed unless otherwise documented below in the visit note. 

## 2016-01-21 NOTE — Assessment & Plan Note (Signed)
Suspect trapezius strain versus cervical spine strain versus less likely nerve impingement as cause of her symptoms. She's neurologically intact in her upper and lower extremities. Discussed options for management and she opted for anti-inflammatory use and muscle relaxer with heat and exercises. She deferred imaging. Advised on ibuprofen use. Advised on Flexeril and how this could make her drowsy. She is given return precautions. If not improving in the next several weeks she will let us know.

## 2016-01-21 NOTE — Progress Notes (Signed)
  Marikay AlarEric Sonnenberg, MD Phone: [phone]  Suella BroadKatharine Ann Dorris CarnesShields is a 50 y.o. female who presents today for day visit.  Patient notes for the last 3 or 4 months she has felt as though she has a crick in the left side of her neck. Notes no particularly injury. Notes some days are worse than others. Notes it hurts more the next day after she lays on her left side at night. Does radiate down to her left shoulder though not down her left arm. She notes no numbness or weakness with this. Has changed her pillow and that did not help. Pain is better today. Has been using Advil fairly frequently for this.  ROS see history of present illness  Objective  Physical Exam Vitals:   01/21/16 1514  BP: 126/78  Pulse: 87  Temp: 98.6 F (37 C)    BP Readings from Last 3 Encounters:  01/21/16 126/78  11/24/15 132/82  04/23/15 118/80   Wt Readings from Last 3 Encounters:  01/21/16 144 lb 3.2 oz (65.4 kg)  11/24/15 139 lb (63 kg)  04/23/15 138 lb 2 oz (62.7 kg)    Physical Exam  Constitutional: She is well-developed, well-nourished, and in no distress.  Cardiovascular: Normal rate, regular rhythm and normal heart sounds.   Pulmonary/Chest: Effort normal and breath sounds normal.  Musculoskeletal:  No midline spine tenderness, no midline spine step-off, no muscular back tenderness, there is mild tenderness over the left trapezius, no skin changes, no spasm  Neurological:  5/5 strength in bilateral biceps, triceps, grip, quads, hamstrings, plantar and dorsiflexion, sensation to light touch intact in bilateral UE and LE, normal gait     Assessment/Plan: Please see individual problem list.  Trapezius strain Suspect trapezius strain versus cervical spine strain versus less likely nerve impingement as cause of her symptoms. She's neurologically intact in her upper and lower extremities. Discussed options for management and she opted for anti-inflammatory use and muscle relaxer with heat and  exercises. She deferred imaging. Advised on ibuprofen use. Advised on Flexeril and how this could make her drowsy. She is given return precautions. If not improving in the next several weeks she will let us know.   No orders of the defined types were placed in this encounter.   Meds ordered this encounter  Medications  . cyclobenzaprine (FLEXERIL) 10 MG tablet    Sig: Take 1 tablet (10 mg total) by mouth 3 (three) times daily as needed for muscle spasms.    Dispense:  30 tablet    Refill:  0   Marikay AlarEric Sonnenberg, MD [facility] [place]

## 2016-02-22 ENCOUNTER — Other Ambulatory Visit: Payer: Self-pay | Admitting: Internal Medicine

## 2016-02-22 NOTE — Telephone Encounter (Signed)
Last filled 11/24/15 60 2rf

## 2016-02-29 ENCOUNTER — Ambulatory Visit (INDEPENDENT_AMBULATORY_CARE_PROVIDER_SITE_OTHER): Payer: 59 | Admitting: Internal Medicine

## 2016-02-29 ENCOUNTER — Encounter: Payer: Self-pay | Admitting: Internal Medicine

## 2016-02-29 DIAGNOSIS — R748 Abnormal levels of other serum enzymes: Secondary | ICD-10-CM | POA: Insufficient documentation

## 2016-02-29 DIAGNOSIS — F439 Reaction to severe stress, unspecified: Secondary | ICD-10-CM | POA: Diagnosis not present

## 2016-02-29 DIAGNOSIS — K219 Gastro-esophageal reflux disease without esophagitis: Secondary | ICD-10-CM

## 2016-02-29 DIAGNOSIS — G479 Sleep disorder, unspecified: Secondary | ICD-10-CM

## 2016-02-29 DIAGNOSIS — N6002 Solitary cyst of left breast: Secondary | ICD-10-CM

## 2016-02-29 DIAGNOSIS — E039 Hypothyroidism, unspecified: Secondary | ICD-10-CM

## 2016-02-29 DIAGNOSIS — M542 Cervicalgia: Secondary | ICD-10-CM

## 2016-02-29 DIAGNOSIS — E7800 Pure hypercholesterolemia, unspecified: Secondary | ICD-10-CM

## 2016-02-29 LAB — HEPATIC FUNCTION PANEL
ALT: 22 U/L (ref 0–35)
AST: 17 U/L (ref 0–37)
Albumin: 4.4 g/dL (ref 3.5–5.2)
Alkaline Phosphatase: 60 U/L (ref 39–117)
BILIRUBIN TOTAL: 0.4 mg/dL (ref 0.2–1.2)
Bilirubin, Direct: 0.1 mg/dL (ref 0.0–0.3)
TOTAL PROTEIN: 7 g/dL (ref 6.0–8.3)

## 2016-02-29 MED ORDER — CYCLOBENZAPRINE HCL 10 MG PO TABS: 10.0000 mg | ORAL_TABLET | Freq: Every evening | ORAL | 0 refills | Status: DC | PRN

## 2016-02-29 NOTE — Progress Notes (Signed)
Pre visit review using our clinic review tool, if applicable. No additional management support is needed unless otherwise documented below in the visit note. 

## 2016-02-29 NOTE — Progress Notes (Signed)
[name], female   DOB: [birth year], 50 y.o.   MRN: [id number]   Subjective:    [name], female    DOB: [birth year], 50 y.o.   MRN: [id number]  HPI  Patient here for a scheduled follow up.  She states she is doing relatively well.  Neck is better.  Still having some issues with discomfort.  Has been taking flexeril prn.  Discussed taking on a regular basis for a short period of time.  See if taking on a regular basis helps.  She wants to do this and hold on xray, etc at this time.  Increased stress.  D[place] moved to [place].  Overall she feels she is handling things relatively well.  Tries to stay active.  No chest pain.  No sob.  No acid reflux.  No abdominal pain or cramping.  Bowels stable.  Just had cyst aspiration (breast) - 01/12/16.  Off cholesterol medication. Liver tests improved.     Past Medical History:  Diagnosis Date  . GERD (gastroesophageal reflux disease)   . Hypertension    Past Surgical History:  Procedure Laterality Date  . ABDOMINAL HYSTERECTOMY  2011  . BREAST BIOPSY Left 12/26/2007   Dr Evette CristalSankar- neg  . CHOLECYSTECTOMY  03/2012   Dr Malissa HippoW Smith  . UPPER GI ENDOSCOPY     upper Endoscopy (10-15-2010)    Family History  Problem Relation Age of Onset  . Hypertension Mother   . Breast cancer Mother 659    negative genetic testing  . Heart block Father   . Heart disease Maternal Grandmother   . Stroke Maternal Grandmother   . Hypertension Maternal Grandmother   . Heart disease Maternal Grandfather   . Stroke Maternal Grandfather   . Hypertension Maternal Grandfather   . Diabetes Paternal Grandfather    Social History   Social History  . Marital status: Married    Spouse name: [name]  . Number of children: 2  . Years of education: N/A   Occupational History  .  [employer]   Social History Main Topics  . Smoking status: Former Games developermoker  . Smokeless tobacco: Never Used     Comment: smoked in college  .  Alcohol use No  . Drug use: No  . Sexual activity: Not Asked   Other Topics Concern  . None   Social History Narrative  . None    Outpatient Encounter Prescriptions as of 02/29/2016  Medication Sig  . carbamide peroxide (DEBROX) 6.5 % otic solution 3-4 drops in right ear q day.  Massage for 5 minutes.  . cyclobenzaprine (FLEXERIL) 10 MG tablet Take 1 tablet (10 mg total) by mouth at bedtime as needed for muscle spasms.  Marland Kitchen. levothyroxine (SYNTHROID, LEVOTHROID) 100 MCG tablet TAKE 1 TABLET (100 MCG TOTAL) BY MOUTH DAILY.  Marland Kitchen. omeprazole (PRILOSEC) 20 MG capsule Take 20 mg by mouth daily.  . rosuvastatin (CRESTOR) 10 MG tablet Take 1 tablet (10 mg total) by mouth daily.  . traZODone (DESYREL) 50 MG tablet TAKE 2 TABLETS BY MOUTH AT BEDTIME  . [DISCONTINUED] cyclobenzaprine (FLEXERIL) 10 MG tablet Take 1 tablet (10 mg total) by mouth 3 (three) times daily as needed for muscle spasms.   No facility-administered encounter medications on file as of 02/29/2016.     Review of Systems  Constitutional: Negative for appetite change and unexpected weight change.  HENT: Negative for congestion and sinus pressure.   Respiratory: Negative for cough, chest tightness and shortness  of breath.   Cardiovascular: Negative for chest pain, palpitations and leg swelling.  Gastrointestinal: Negative for abdominal pain, diarrhea, nausea and vomiting.  Genitourinary: Negative for difficulty urinating and dysuria.  Musculoskeletal: Positive for neck pain. Negative for back pain.  Skin: Negative for color change and rash.  Neurological: Negative for dizziness, light-headedness and headaches.  Psychiatric/Behavioral: Negative for agitation and dysphoric mood.       Objective:    Physical Exam  Constitutional: She appears well-developed and well-nourished. No distress.  HENT:  Nose: Nose normal.  Mouth/Throat: Oropharynx is clear and moist.  Neck: Neck supple. No thyromegaly present.  Cardiovascular:  Normal rate and regular rhythm.   Pulmonary/Chest: Breath sounds normal. No respiratory distress. She has no wheezes.  Abdominal: Soft. Bowel sounds are normal. There is no tenderness.  Musculoskeletal: She exhibits no edema or tenderness.  Lymphadenopathy:    She has no cervical adenopathy.  Skin: No rash noted. No erythema.  Psychiatric: She has a normal mood and affect.    BP 132/86   Pulse 76   Temp 97.9 F (36.6 C) (Oral)   Ht 5\' 2"  (1.575 m)   Wt 142 lb 3.2 oz (64.5 kg)   LMP 06/26/2009   SpO2 99%   BMI 26.01 kg/m  Wt Readings from Last 3 Encounters:  02/29/16 142 lb 3.2 oz (64.5 kg)  01/21/16 144 lb 3.2 oz (65.4 kg)  11/24/15 139 lb (63 kg)     Lab Results  Component Value Date   WBC 7.7 11/24/2015   HGB 13.4 11/24/2015   HCT 39.2 11/24/2015   PLT 337.0 11/24/2015   GLUCOSE 87 11/24/2015   CHOL 246 (H) 11/24/2015   TRIG 203.0 (H) 11/24/2015   HDL 46.70 11/24/2015   LDLDIRECT 152.0 11/24/2015   LDLCALC 171 (H) 09/29/2014   ALT 22 02/29/2016   AST 17 02/29/2016   NA 140 11/24/2015   K 4.2 11/24/2015   CL 105 11/24/2015   CREATININE 0.78 11/24/2015   BUN 8 11/24/2015   CO2 30 11/24/2015   TSH 1.05 11/24/2015    [place] Breast Aspiration Left  Result Date: 01/12/2016 CLINICAL DATA:  50 year old female for aspiration of a symptomatic left breast cyst at 3 o'clock, 2 cm from the nipple EXAM: ULTRASOUND GUIDED LEFT BREAST CYST ASPIRATION COMPARISON:  Previous exams. PROCEDURE: Using sterile technique, 1% lidocaine, under direct ultrasound visualization, needle aspiration of a left breast cyst at 3 o'clock, 2 cm from the nipple was performed. The cyst completely collapsed following aspiration yielding approximately 60 cc of nonbloody, greenish fluid. IMPRESSION: Ultrasound-guided aspiration of a left breast cyst at 3 o'clock, 2 cm from the nipple. No apparent complications. RECOMMENDATIONS: Screening mammogram in one year.(Code:SM-[id number]) Electronically Signed   By: Dalphine HandingErin   Shaw M.D.   On: 01/12/2016 13:48       Assessment & Plan:   Problem List Items Addressed This Visit    Abnormal liver enzymes    Off cholesterol medication.  Improved.  Recheck today to confirm back to normal.        Relevant Orders   Hepatic function panel (Completed)   Breast cyst    S/p aspiration 01/12/16.  Doing well.        GERD (gastroesophageal reflux disease)    On omeprazole.  Controlled.        Hypercholesterolemia    Off cholesterol medication.  Liver tests better.  Recheck today.  Follow.       Hypothyroidism    On thyroid replacement.  Follow tsh.       Neck pain    Scheduled flexeril.  Follow.  Is better.        Sleep disturbance    On trazodone.  Works well for her.  Follow.        Stress    Increased stress with her daughter moving.  Using trazodone to help her sleep.  Follow.  Does not feel needs any further intervention at this time.  Follow.            Dale Cygnet, MD

## 2016-03-01 ENCOUNTER — Encounter: Payer: Self-pay | Admitting: Internal Medicine

## 2016-03-05 ENCOUNTER — Encounter: Payer: Self-pay | Admitting: Internal Medicine

## 2016-03-05 DIAGNOSIS — M542 Cervicalgia: Secondary | ICD-10-CM | POA: Insufficient documentation

## 2016-03-05 NOTE — Assessment & Plan Note (Signed)
Off cholesterol medication.  Improved.  Recheck today to confirm back to normal.

## 2016-03-05 NOTE — Assessment & Plan Note (Signed)
On omeprazole.  Controlled.   

## 2016-03-05 NOTE — Assessment & Plan Note (Signed)
Increased stress with her daughter moving.  Using trazodone to help her sleep.  Follow.  Does not feel needs any further intervention at this time.  Follow.

## 2016-03-05 NOTE — Assessment & Plan Note (Signed)
On thyroid replacement.  Follow tsh.  

## 2016-03-05 NOTE — Assessment & Plan Note (Signed)
S/p aspiration 01/12/16.  Doing well.

## 2016-03-05 NOTE — Assessment & Plan Note (Signed)
Off cholesterol medication.  Liver tests better.  Recheck today.  Follow.

## 2016-03-05 NOTE — Assessment & Plan Note (Signed)
Scheduled flexeril.  Follow.  Is better.

## 2016-03-05 NOTE — Assessment & Plan Note (Signed)
On trazodone.  Works well for her.  Follow.

## 2016-03-06 ENCOUNTER — Other Ambulatory Visit: Payer: Self-pay | Admitting: Internal Medicine

## 2016-03-06 ENCOUNTER — Other Ambulatory Visit: Payer: Self-pay | Admitting: Family Medicine

## 2016-03-06 NOTE — Telephone Encounter (Signed)
Please advise on refill.

## 2016-03-07 ENCOUNTER — Encounter: Payer: Self-pay | Admitting: Internal Medicine

## 2016-03-07 MED ORDER — CYCLOBENZAPRINE HCL 10 MG PO TABS: 10.0000 mg | ORAL_TABLET | Freq: Every evening | ORAL | 0 refills | Status: DC | PRN

## 2016-03-07 NOTE — Telephone Encounter (Signed)
It appears patient has discussed this with Dr. Lorin PicketScott. Refill should be completed through through Dr. Lorin PicketScott.

## 2016-03-22 ENCOUNTER — Other Ambulatory Visit: Payer: Self-pay | Admitting: Internal Medicine

## 2016-04-20 ENCOUNTER — Encounter: Payer: Self-pay | Admitting: Internal Medicine

## 2016-04-20 MED ORDER — AZITHROMYCIN 250 MG PO TABS: ORAL_TABLET | ORAL | 0 refills | Status: DC

## 2016-04-20 MED ORDER — CYCLOBENZAPRINE HCL 10 MG PO TABS: 10.0000 mg | ORAL_TABLET | Freq: Every evening | ORAL | 0 refills | Status: DC | PRN

## 2016-04-20 NOTE — Telephone Encounter (Signed)
See my chart message.  Sent in rx for zapk and flexeril.

## 2016-05-17 ENCOUNTER — Other Ambulatory Visit: Payer: Self-pay | Admitting: Internal Medicine

## 2016-05-17 NOTE — Telephone Encounter (Signed)
Refilled on 02/23/2016 with 2 refills. Last office visit 02/29/2016. Follow up appt scheduled for 07/05/2016.

## 2016-07-05 ENCOUNTER — Encounter: Payer: Self-pay | Admitting: Internal Medicine

## 2016-07-05 ENCOUNTER — Ambulatory Visit (INDEPENDENT_AMBULATORY_CARE_PROVIDER_SITE_OTHER): Payer: 59 | Admitting: Internal Medicine

## 2016-07-05 ENCOUNTER — Ambulatory Visit (INDEPENDENT_AMBULATORY_CARE_PROVIDER_SITE_OTHER): Payer: 59

## 2016-07-05 VITALS — BP 118/68 | HR 86 | Temp 98.4°F | Resp 14 | Ht 62.0 in | Wt 140.8 lb

## 2016-07-05 DIAGNOSIS — M542 Cervicalgia: Secondary | ICD-10-CM | POA: Diagnosis not present

## 2016-07-05 DIAGNOSIS — E7800 Pure hypercholesterolemia, unspecified: Secondary | ICD-10-CM | POA: Diagnosis not present

## 2016-07-05 DIAGNOSIS — R748 Abnormal levels of other serum enzymes: Secondary | ICD-10-CM

## 2016-07-05 DIAGNOSIS — G479 Sleep disorder, unspecified: Secondary | ICD-10-CM

## 2016-07-05 DIAGNOSIS — F439 Reaction to severe stress, unspecified: Secondary | ICD-10-CM | POA: Diagnosis not present

## 2016-07-05 DIAGNOSIS — I10 Essential (primary) hypertension: Secondary | ICD-10-CM | POA: Diagnosis not present

## 2016-07-05 DIAGNOSIS — K219 Gastro-esophageal reflux disease without esophagitis: Secondary | ICD-10-CM

## 2016-07-05 DIAGNOSIS — E039 Hypothyroidism, unspecified: Secondary | ICD-10-CM | POA: Diagnosis not present

## 2016-07-05 MED ORDER — LISINOPRIL 10 MG PO TABS: 10.0000 mg | ORAL_TABLET | Freq: Every day | ORAL | 1 refills | Status: DC

## 2016-07-05 MED ORDER — CYCLOBENZAPRINE HCL 10 MG PO TABS: 10.0000 mg | ORAL_TABLET | Freq: Every evening | ORAL | 0 refills | Status: DC | PRN

## 2016-07-05 MED ORDER — LEVOTHYROXINE SODIUM 100 MCG PO TABS: 100.0000 ug | ORAL_TABLET | Freq: Every day | ORAL | 1 refills | Status: DC

## 2016-07-05 NOTE — Progress Notes (Signed)
[name], female   DOB: 1965/08/22, 51 y.o.   MRN: [id number]   Subjective:    [name], female    DOB: [birth year], 51 y.o.   MRN: [id number]  HPI  Patient here for a scheduled follow up.  She reports she is doing well.  Trazodone is working well for her.  Sleeps well when on trazodone.   She does report still having issues with her neck.  Some neck discomfort.  Aggravated by certain positions.  No significant headaches.  Takes muscle relaxer prn. Helps when takes.  No chest pain.  No sob.  No acid reflux.  No abdominal pain.  Bowels moving.  Blood pressure remaining elevated.     Past Medical History:  Diagnosis Date  . GERD (gastroesophageal reflux disease)   . Hypertension    Past Surgical History:  Procedure Laterality Date  . ABDOMINAL HYSTERECTOMY  2011  . BREAST BIOPSY Left 12/26/2007   Dr Evette Cristal- neg  . CHOLECYSTECTOMY  03/2012   Dr Malissa Hippo  . UPPER GI ENDOSCOPY     upper Endoscopy (10-15-2010)    Family History  Problem Relation Age of Onset  . Hypertension Mother   . Breast cancer Mother 31    negative genetic testing  . Heart block Father   . Heart disease Maternal Grandmother   . Stroke Maternal Grandmother   . Hypertension Maternal Grandmother   . Heart disease Maternal Grandfather   . Stroke Maternal Grandfather   . Hypertension Maternal Grandfather   . Diabetes Paternal Grandfather    Social History   Social History  . Marital status: Married    Spouse name: Jonny Ruiz  . Number of children: 2  . Years of education: N/A   Occupational History  .  [employer]   Social History Main Topics  . Smoking status: Former Games developer  . Smokeless tobacco: Never Used     Comment: smoked in college  . Alcohol use No  . Drug use: No  . Sexual activity: Not Asked   Other Topics Concern  . None   Social History Narrative  . None    Outpatient Encounter Prescriptions as of 07/05/2016  Medication Sig  .  cyclobenzaprine (FLEXERIL) 10 MG tablet Take 1 tablet (10 mg total) by mouth at bedtime as needed for muscle spasms.  Marland Kitchen levothyroxine (SYNTHROID, LEVOTHROID) 100 MCG tablet Take 1 tablet (100 mcg total) by mouth daily before breakfast.  . omeprazole (PRILOSEC) 20 MG capsule Take 20 mg by mouth daily.  . traZODone (DESYREL) 50 MG tablet TAKE 2 TABLETS BY MOUTH AT BEDTIME  . [DISCONTINUED] cyclobenzaprine (FLEXERIL) 10 MG tablet Take 1 tablet (10 mg total) by mouth at bedtime as needed for muscle spasms.  . [DISCONTINUED] levothyroxine (SYNTHROID, LEVOTHROID) 100 MCG tablet TAKE 1 TABLET (100 MCG TOTAL) BY MOUTH DAILY.  Marland Kitchen lisinopril (PRINIVIL,ZESTRIL) 10 MG tablet Take 1 tablet (10 mg total) by mouth daily.  . [DISCONTINUED] azithromycin (ZITHROMAX) 250 MG tablet Take 2 tablets x 1 day and then one tablet per day for four more days.  . [DISCONTINUED] carbamide peroxide (DEBROX) 6.5 % otic solution 3-4 drops in right ear q day.  Massage for 5 minutes.  . [DISCONTINUED] rosuvastatin (CRESTOR) 10 MG tablet Take 1 tablet (10 mg total) by mouth daily.   No facility-administered encounter medications on file as of 07/05/2016.     Review of Systems  Constitutional: Negative for appetite change and unexpected weight change.  HENT:  Negative for congestion and sinus pressure.   Respiratory: Negative for cough, chest tightness and shortness of breath.   Cardiovascular: Negative for chest pain, palpitations and leg swelling.  Gastrointestinal: Negative for abdominal pain, diarrhea, nausea and vomiting.  Genitourinary: Negative for difficulty urinating and dysuria.  Musculoskeletal: Positive for neck pain. Negative for back pain and joint swelling.  Skin: Negative for color change and rash.  Neurological: Negative for dizziness and light-headedness.  Psychiatric/Behavioral: Negative for agitation and dysphoric mood.       Objective:    Physical Exam  Constitutional: She appears well-developed and  well-nourished. No distress.  HENT:  Nose: Nose normal.  Mouth/Throat: Oropharynx is clear and moist.  Neck: Neck supple. No thyromegaly present.  Cardiovascular: Normal rate and regular rhythm.   Pulmonary/Chest: Breath sounds normal. No respiratory distress. She has no wheezes.  Abdominal: Soft. Bowel sounds are normal. There is no tenderness.  Musculoskeletal: She exhibits no edema or tenderness.  Some minimal discomfort with rotation of her head from right to left.  No significant neck stiffness.    Lymphadenopathy:    She has no cervical adenopathy.  Skin: No rash noted. No erythema.  Psychiatric: She has a normal mood and affect. Her behavior is normal.    BP 118/68 (BP Location: Left Arm, Patient Position: Sitting, Cuff Size: Normal)   Pulse 86   Temp 98.4 F (36.9 C) (Oral)   Resp 14   Ht  (1.575 m)   Wt 140 lb 12.8 oz (63.9 kg)   LMP 06/26/2009   SpO2 99%   BMI 25.75 kg/m  Wt Readings from Last 3 Encounters:  07/05/16 140 lb 12.8 oz (63.9 kg)  02/29/16 142 lb 3.2 oz (64.5 kg)  01/21/16 144 lb 3.2 oz (65.4 kg)     Lab Results  Component Value Date   WBC 7.7 11/24/2015   HGB 13.4 11/24/2015   HCT 39.2 11/24/2015   PLT 337.0 11/24/2015   GLUCOSE 87 11/24/2015   CHOL 246 (H) 11/24/2015   TRIG 203.0 (H) 11/24/2015   HDL 46.70 11/24/2015   LDLDIRECT 152.0 11/24/2015   LDLCALC 171 (H) 09/29/2014   ALT 22 02/29/2016   AST 17 02/29/2016   NA 140 11/24/2015   K 4.2 11/24/2015   CL 105 11/24/2015   CREATININE 0.78 11/24/2015   BUN 8 11/24/2015   CO2 30 11/24/2015   TSH 1.05 11/24/2015    [place] Breast Aspiration Left  Result Date: 01/12/2016 CLINICAL DATA:  51 year old female for aspiration of a symptomatic left breast cyst at 3 o'clock, 2 cm from the nipple EXAM: ULTRASOUND GUIDED LEFT BREAST CYST ASPIRATION COMPARISON:  Previous exams. PROCEDURE: Using sterile technique, 1% lidocaine, under direct ultrasound visualization, needle aspiration of a left  breast cyst at 3 o'clock, 2 cm from the nipple was performed. The cyst completely collapsed following aspiration yielding approximately 60 cc of nonbloody, greenish fluid. IMPRESSION: Ultrasound-guided aspiration of a left breast cyst at 3 o'clock, 2 cm from the nipple. No apparent complications. RECOMMENDATIONS: Screening mammogram in one year.(Code:[id number]-[id number]) Electronically Signed   By: Dalphine Handing M.D.   On: 01/12/2016 13:48       Assessment & Plan:   Problem List Items Addressed This Visit    Abnormal liver enzymes    Off cholesterol medication.  Follow liver panel.        Essential hypertension    Blood pressure elevated on myc heck.  Has been elevated.  Start lisinopril  q day.  Check  metabolic panel in 10-14 days.  Follow pressures.  Get her back in soon to reassess.       Relevant Medications   lisinopril (PRINIVIL,ZESTRIL) 10 MG tablet   Other Relevant Orders   Basic metabolic panel   GERD (gastroesophageal reflux disease)    On omeprazole.  Follow.        Hypercholesterolemia    Off cholesterol medication.  Liver tests improved.  Follow lipid panel.  She wants to work on diet and exercise.  Wants to hold on cholesterol recheck at this time.  Follow.       Relevant Medications   lisinopril (PRINIVIL,ZESTRIL) 10 MG tablet   Hypothyroidism    On thyroid medication.  Follow tsh.       Relevant Medications   levothyroxine (SYNTHROID, LEVOTHROID) 100 MCG tablet   Neck pain - Primary    Has flexeril if needed.  Does help.  Check c-spine xray.  Follow.        Relevant Orders   DG Cervical Spine 2 or 3 views (Completed)   Sleep disturbance    Trazodone works well for her.  Follow.       Stress    She feels she is handling stress relatively well.  Trazodone works well for her sleep.  Follow.            Dale Lanham, MD

## 2016-07-05 NOTE — Progress Notes (Signed)
Pre-visit discussion using our clinic review tool. No additional management support is needed unless otherwise documented below in the visit note.  

## 2016-07-06 ENCOUNTER — Telehealth: Payer: Self-pay

## 2016-07-06 DIAGNOSIS — M542 Cervicalgia: Secondary | ICD-10-CM

## 2016-07-06 NOTE — Telephone Encounter (Signed)
Pt called back returning your call. Thank you!  Call pt @ [phone]

## 2016-07-06 NOTE — Telephone Encounter (Signed)
Left message to return call to our office.  

## 2016-07-06 NOTE — Telephone Encounter (Signed)
-----   Message from Dale Waldo, MD sent at 07/05/2016  9:06 PM EDT ----- Please call and notify pt that her neck xray reveals no acute abnormality.  Given persistent pain, I would like to refer her to physical therapy for further evaluation and treatment.  If agreeable, let me know and I will place the order for the referral.

## 2016-07-07 ENCOUNTER — Encounter: Payer: Self-pay | Admitting: Internal Medicine

## 2016-07-07 ENCOUNTER — Telehealth: Payer: Self-pay | Admitting: *Deleted

## 2016-07-07 NOTE — Telephone Encounter (Signed)
Patient was informed of results.  Patient understood and no questions, comments, or concerns at this time. Also she is ok with Physical therapy referral.

## 2016-07-07 NOTE — Telephone Encounter (Signed)
Order placed for physical therapy referral.   

## 2016-07-07 NOTE — Telephone Encounter (Signed)
Patient has requested a call [phone]

## 2016-07-09 ENCOUNTER — Encounter: Payer: Self-pay | Admitting: Internal Medicine

## 2016-07-09 DIAGNOSIS — I10 Essential (primary) hypertension: Secondary | ICD-10-CM | POA: Insufficient documentation

## 2016-07-09 NOTE — Telephone Encounter (Signed)
Duplicate.  See previous message.   

## 2016-07-09 NOTE — Assessment & Plan Note (Signed)
She feels she is handling stress relatively well.  Trazodone works well for her sleep.  Follow.

## 2016-07-09 NOTE — Assessment & Plan Note (Signed)
Off cholesterol medication.  Follow liver panel.

## 2016-07-09 NOTE — Assessment & Plan Note (Signed)
Trazodone works well for her.  Follow.

## 2016-07-09 NOTE — Assessment & Plan Note (Signed)
On omeprazole.  Follow.  

## 2016-07-09 NOTE — Assessment & Plan Note (Addendum)
Off cholesterol medication.  Liver tests improved.  Follow lipid panel.  She wants to work on diet and exercise.  Wants to hold on cholesterol recheck at this time.  Follow.

## 2016-07-09 NOTE — Assessment & Plan Note (Signed)
On thyroid medication.  Follow tsh.  

## 2016-07-09 NOTE — Telephone Encounter (Signed)
The f/u lab in 2 weeks was for non fasting lab.  She can keep the f/u appt, but is a non fasting lab.  Also, regarding her daughter, I am ok with seeing her, but would prefer ok for Dr Ermalene Searing since in [place] practice.  Thanks

## 2016-07-09 NOTE — Assessment & Plan Note (Signed)
Blood pressure elevated on myc heck.  Has been elevated.  Start lisinopril  q day.  Check metabolic panel in 10-14 days.  Follow pressures.  Get her back in soon to reassess.

## 2016-07-09 NOTE — Assessment & Plan Note (Signed)
Has flexeril if needed.  Does help.  Check c-spine xray.  Follow.

## 2016-07-19 ENCOUNTER — Ambulatory Visit: Payer: 59 | Attending: Internal Medicine

## 2016-07-20 ENCOUNTER — Other Ambulatory Visit: Payer: 59

## 2016-07-24 ENCOUNTER — Ambulatory Visit: Payer: 59

## 2016-07-26 ENCOUNTER — Ambulatory Visit: Payer: 59

## 2016-07-27 ENCOUNTER — Other Ambulatory Visit (INDEPENDENT_AMBULATORY_CARE_PROVIDER_SITE_OTHER): Payer: 59

## 2016-07-27 DIAGNOSIS — I10 Essential (primary) hypertension: Secondary | ICD-10-CM

## 2016-07-27 LAB — BASIC METABOLIC PANEL
BUN: 9 mg/dL (ref 6–23)
CHLORIDE: 106 meq/L (ref 96–112)
CO2: 26 mEq/L (ref 19–32)
Calcium: 10.3 mg/dL (ref 8.4–10.5)
Creatinine, Ser: 0.81 mg/dL (ref 0.40–1.20)
GFR: 79.84 mL/min (ref >60.00)
GLUCOSE: 97 mg/dL (ref 70–99)
POTASSIUM: 4.2 meq/L (ref 3.5–5.1)
SODIUM: 139 meq/L (ref 135–145)

## 2016-07-28 ENCOUNTER — Encounter: Payer: Self-pay | Admitting: Internal Medicine

## 2016-08-20 ENCOUNTER — Other Ambulatory Visit: Payer: Self-pay | Admitting: Internal Medicine

## 2016-08-25 IMAGING — MG MM DIGITAL SCREENING BILAT W/ CAD
5 series · 5 of 5 positions shown · non-contrast
Comparison: Previous exam(s).

CLINICAL DATA: Screening.

EXAM:
DIGITAL SCREENING BILATERAL MAMMOGRAM WITH CAD

[R MLO (1 of 2)]
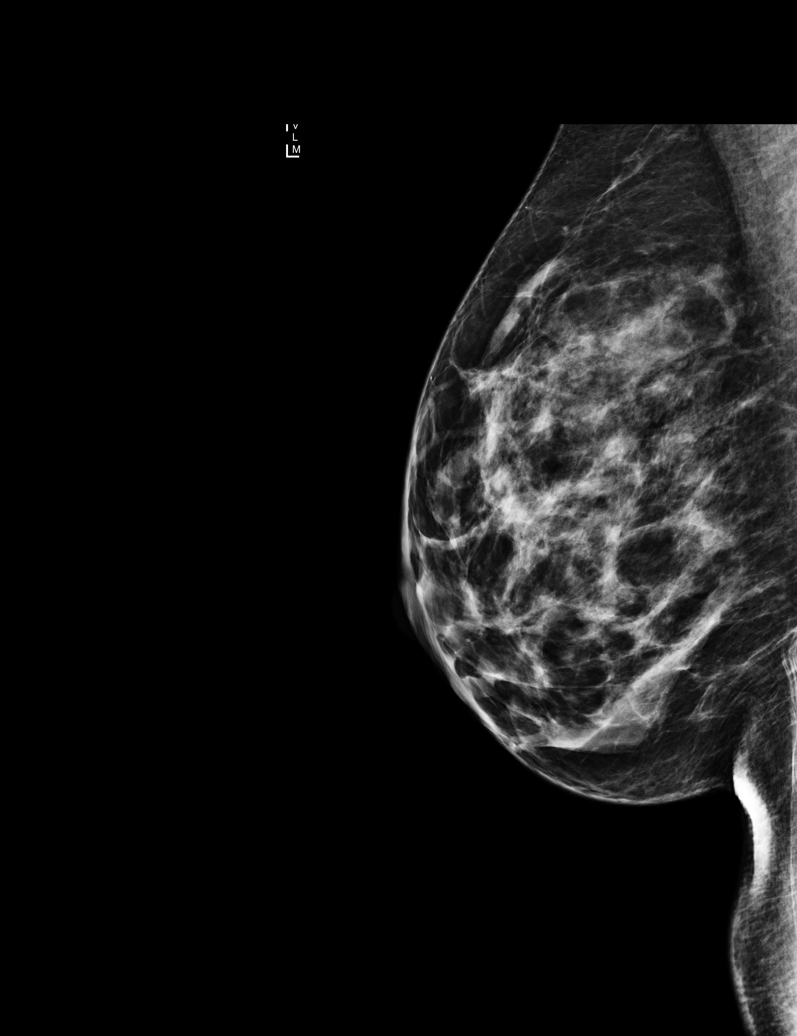

[L CC]
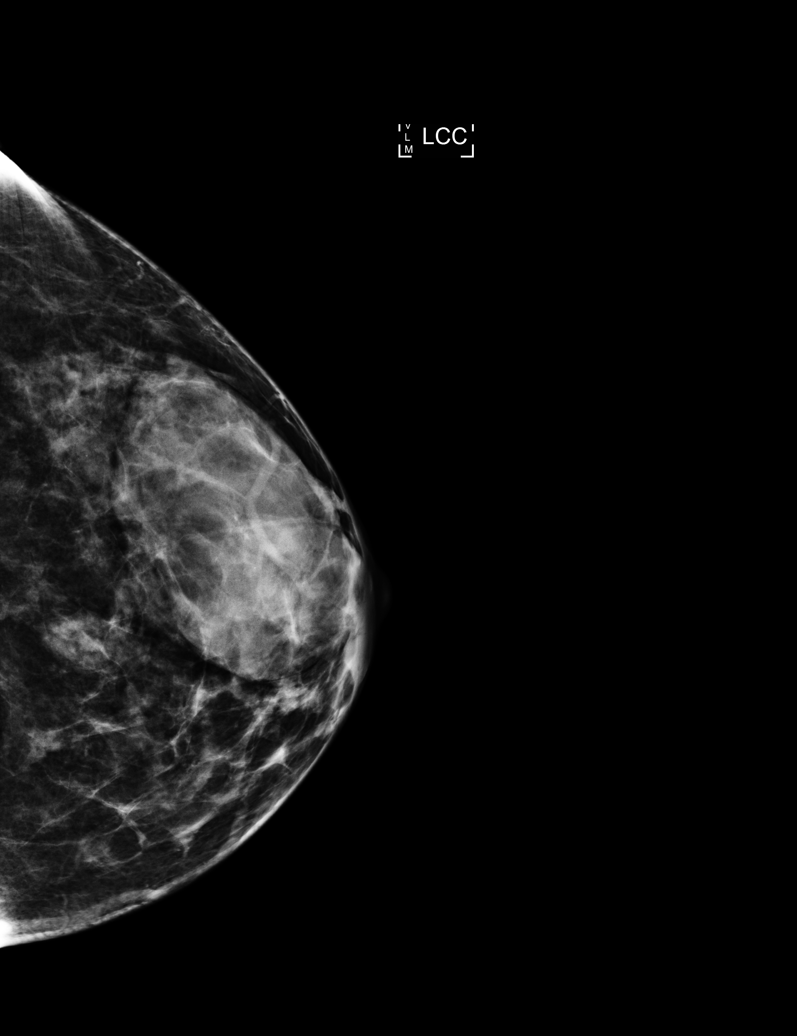

[L MLO]
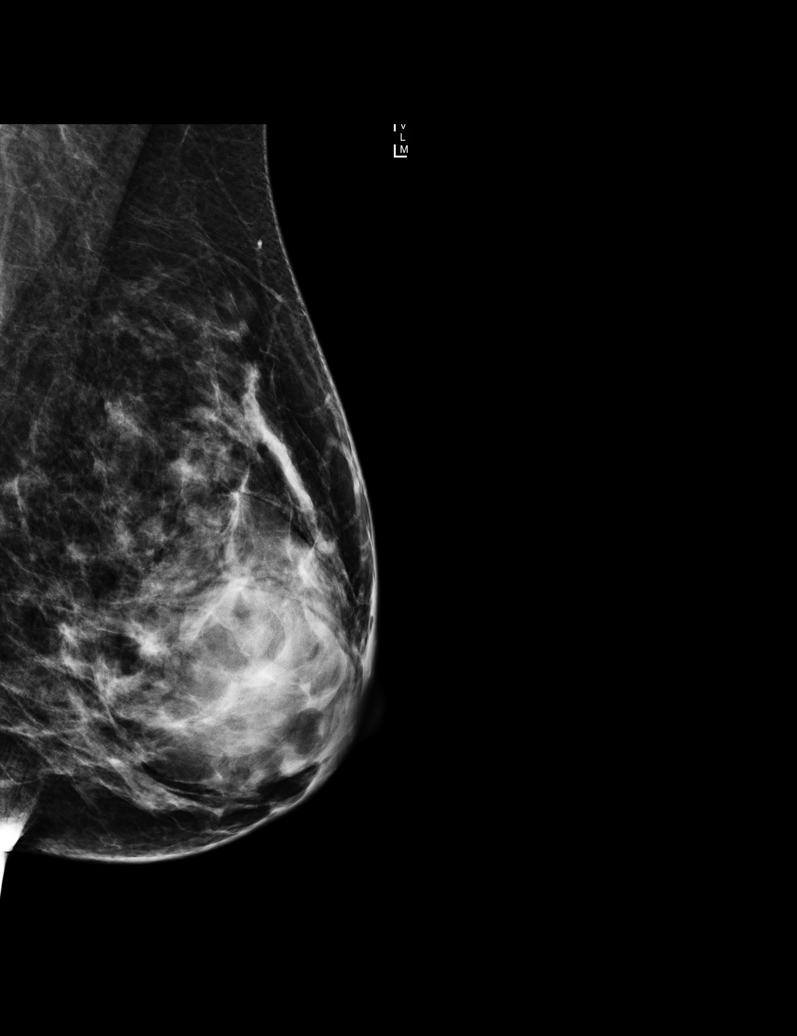

[R CC]
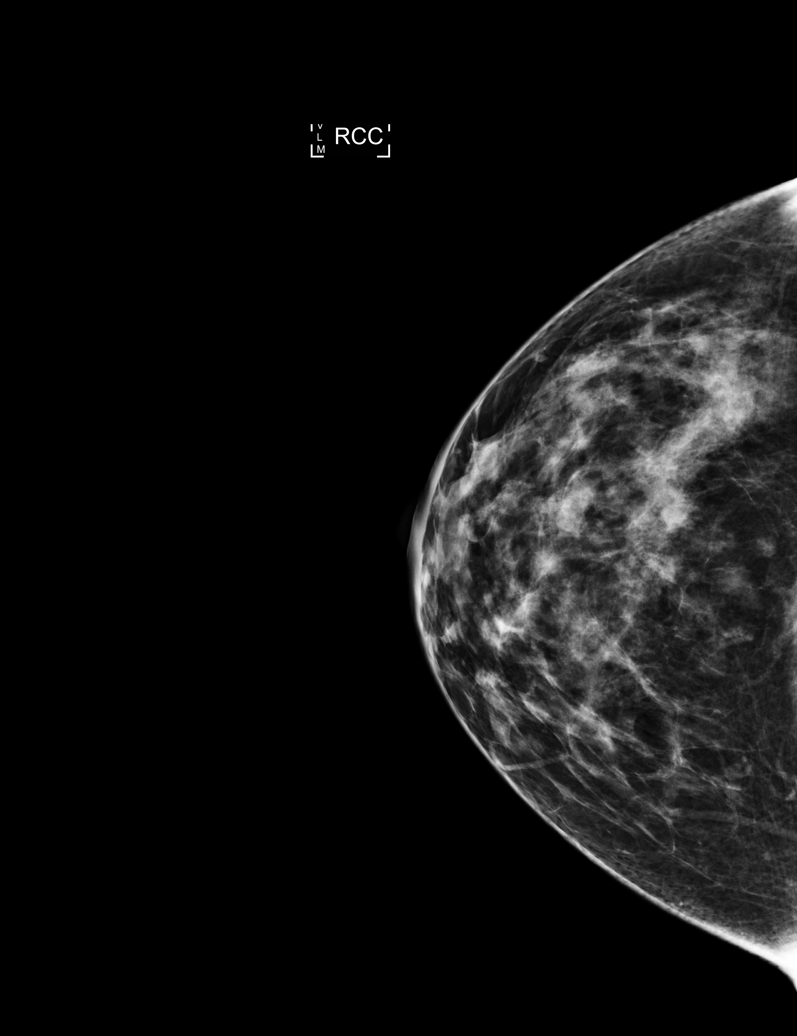

[R MLO (2 of 2)]
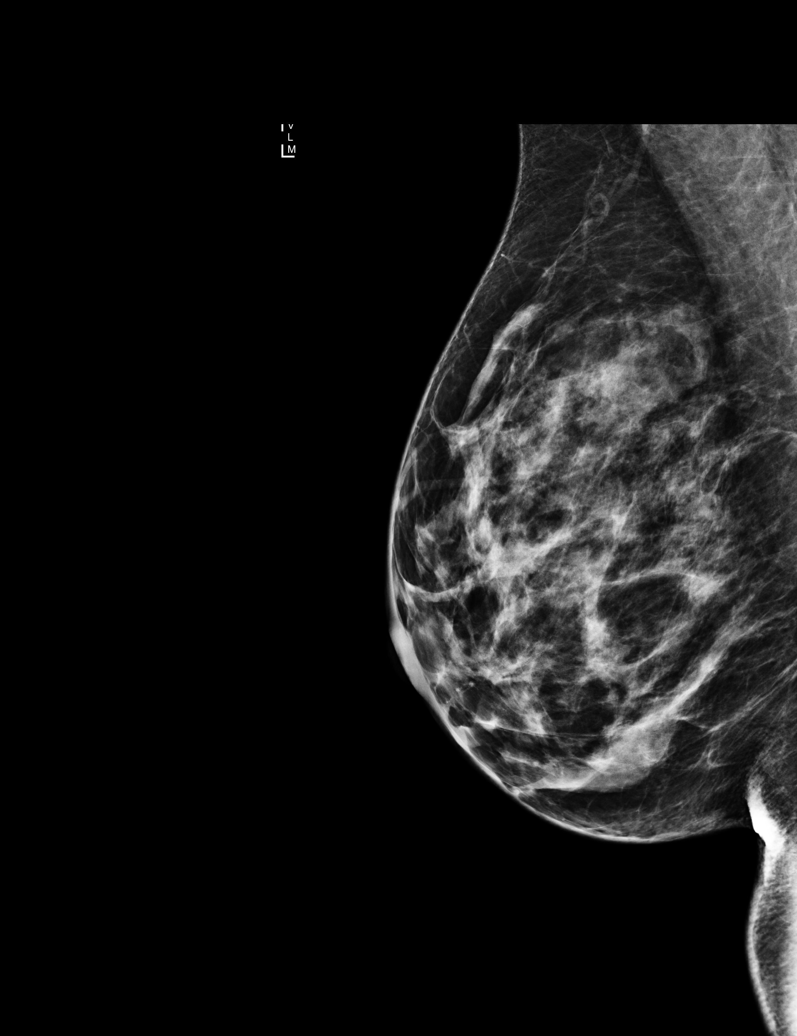

[5 of 5 positions shown; findings below may reference images not displayed]

ACR Breast Density Category c: The breast tissue is heterogeneously
dense, which may obscure small masses.
FINDINGS: In the left breast, a possible mass warrants further evaluation. In
the right breast, no findings suspicious for malignancy.

Images were processed with CAD.
IMPRESSION: Further evaluation is suggested for possible mass in the left
breast.

RECOMMENDATION:
Diagnostic mammogram and possibly ultrasound of the left breast.
(Code:[id number])

The patient will be contacted regarding the findings, and additional
imaging will be scheduled.

BI-RADS CATEGORY  0: Incomplete. Need additional imaging evaluation
and/or prior mammograms for comparison.

## 2016-08-31 ENCOUNTER — Other Ambulatory Visit: Payer: Self-pay | Admitting: Internal Medicine

## 2016-09-01 ENCOUNTER — Other Ambulatory Visit: Payer: Self-pay | Admitting: Internal Medicine

## 2016-09-01 NOTE — Telephone Encounter (Signed)
Ok to refill 

## 2016-09-02 NOTE — Telephone Encounter (Signed)
Please confirm with pt if still needs and confirm how often taking.  If still having increased pain and problems, will need to be reevaluated.  I do not mind refilling, just need more information.

## 2016-09-04 NOTE — Telephone Encounter (Signed)
Left message to return call to our office.  

## 2016-09-07 ENCOUNTER — Telehealth: Payer: Self-pay | Admitting: Internal Medicine

## 2016-09-07 NOTE — Telephone Encounter (Signed)
Pt needs a script for typhoid injection sent to [facility]. Please advise

## 2016-09-07 NOTE — Telephone Encounter (Signed)
We refer to travel clinic for evaluation and recs for travel.  They have certain recommendations for vaccines, etc prior to travel.

## 2016-09-07 NOTE — Telephone Encounter (Signed)
Left message for patient to return my call.

## 2016-09-07 NOTE — Telephone Encounter (Signed)
Ok to send script ??

## 2016-09-07 NOTE — Telephone Encounter (Signed)
Patient stated she is going to [place] and cannot get into Travel clinic fo r30 days and she leaves in one week and patient stated she is ure she has to have this vaccine.

## 2016-09-08 ENCOUNTER — Ambulatory Visit (INDEPENDENT_AMBULATORY_CARE_PROVIDER_SITE_OTHER): Payer: 59 | Admitting: Internal Medicine

## 2016-09-08 ENCOUNTER — Encounter: Payer: Self-pay | Admitting: Internal Medicine

## 2016-09-08 DIAGNOSIS — I10 Essential (primary) hypertension: Secondary | ICD-10-CM

## 2016-09-08 DIAGNOSIS — E7800 Pure hypercholesterolemia, unspecified: Secondary | ICD-10-CM | POA: Diagnosis not present

## 2016-09-08 DIAGNOSIS — E039 Hypothyroidism, unspecified: Secondary | ICD-10-CM | POA: Diagnosis not present

## 2016-09-08 DIAGNOSIS — G479 Sleep disorder, unspecified: Secondary | ICD-10-CM

## 2016-09-08 DIAGNOSIS — K219 Gastro-esophageal reflux disease without esophagitis: Secondary | ICD-10-CM | POA: Diagnosis not present

## 2016-09-08 DIAGNOSIS — F439 Reaction to severe stress, unspecified: Secondary | ICD-10-CM | POA: Diagnosis not present

## 2016-09-08 DIAGNOSIS — Z7189 Other specified counseling: Secondary | ICD-10-CM | POA: Diagnosis not present

## 2016-09-08 DIAGNOSIS — Z7184 Encounter for health counseling related to travel: Secondary | ICD-10-CM

## 2016-09-08 MED ORDER — LOSARTAN POTASSIUM 25 MG PO TABS: 25.0000 mg | ORAL_TABLET | Freq: Every day | ORAL | 2 refills | Status: DC

## 2016-09-08 MED ORDER — TYPHOID VACCINE PO CPDR: 1.0000 | DELAYED_RELEASE_CAPSULE | ORAL | 0 refills | Status: DC

## 2016-09-08 NOTE — Progress Notes (Signed)
Patient ID: Crystina Borrayo, female   DOB: 1966/02/09, 51 y.o.   MRN: [id number]   Subjective:    Patient ID: [name], female    DOB: 29-Aug-1965, 51 y.o.   MRN: [id number]  HPI  Patient here for a scheduled follow up.  She is planning to go to [place] for mission trip.  Was needing typhoid vaccine.  States she has had hepatitis A vaccine, tetanus, etc.  States up to date with everything except typhoid.  No chest pain.  No sob.  Does report cough with lisinopril.  Cough started after taking lisinopril  Blood pressure has been doing well.  No acid reflux.  No abdominal pain.  Bowel moving.  Handling stress.  Overall she feels she is doing well.    Past Medical History:  Diagnosis Date  . GERD (gastroesophageal reflux disease)   . Hypertension    Past Surgical History:  Procedure Laterality Date  . ABDOMINAL HYSTERECTOMY  2011  . BREAST BIOPSY Left 12/26/2007   Dr Evette Cristal- neg  . CHOLECYSTECTOMY  03/2012   Dr Malissa Hippo  . UPPER GI ENDOSCOPY     upper Endoscopy (10-15-2010)    Family History  Problem Relation Age of Onset  . Hypertension Mother   . Breast cancer Mother 65       negative genetic testing  . Heart block Father   . Heart disease Maternal Grandmother   . Stroke Maternal Grandmother   . Hypertension Maternal Grandmother   . Heart disease Maternal Grandfather   . Stroke Maternal Grandfather   . Hypertension Maternal Grandfather   . Diabetes Paternal Grandfather    Social History   Social History  . Marital status: Married    Spouse name: Jonny Ruiz  . Number of children: 2  . Years of education: N/A   Occupational History  .  [employer]   Social History Main Topics  . Smoking status: Former Games developer  . Smokeless tobacco: Never Used     Comment: smoked in college  . Alcohol use No  . Drug use: No  . Sexual activity: Not Asked   Other Topics Concern  . None   Social History Narrative  . None    Outpatient Encounter Prescriptions as of  09/08/2016  Medication Sig  . cyclobenzaprine (FLEXERIL) 10 MG tablet Take 1 tablet (10 mg total) by mouth at bedtime as needed for muscle spasms.  Marland Kitchen levothyroxine (SYNTHROID, LEVOTHROID) 100 MCG tablet Take 1 tablet (100 mcg total) by mouth daily before breakfast.  . omeprazole (PRILOSEC) 20 MG capsule Take 20 mg by mouth daily.  . traZODone (DESYREL) 50 MG tablet TAKE 2 TABLETS BY MOUTH AT BEDTIME  . [DISCONTINUED] lisinopril (PRINIVIL,ZESTRIL) 10 MG tablet TAKE 1 TABLET (10 MG TOTAL) BY MOUTH DAILY.  Marland Kitchen losartan (COZAAR) 25 MG tablet Take 1 tablet (25 mg total) by mouth daily.  . typhoid (VIVOTIF) DR capsule Take 1 capsule by mouth every other day.   No facility-administered encounter medications on file as of 09/08/2016.     Review of Systems  Constitutional: Negative for appetite change and unexpected weight change.  HENT: Negative for congestion and sinus pressure.   Respiratory: Positive for cough. Negative for chest tightness and shortness of breath.   Cardiovascular: Negative for chest pain, palpitations and leg swelling.  Gastrointestinal: Negative for abdominal pain, diarrhea, nausea and vomiting.  Genitourinary: Negative for difficulty urinating and dysuria.  Musculoskeletal: Negative for back pain and joint swelling.  Skin: Negative for color  change and rash.  Neurological: Negative for dizziness, light-headedness and headaches.  Psychiatric/Behavioral: Negative for agitation and dysphoric mood.       Objective:     Blood pressure rechecked by me:  110/68  Physical Exam  Constitutional: She appears well-developed and well-nourished. No distress.  HENT:  Nose: Nose normal.  Mouth/Throat: Oropharynx is clear and moist.  Neck: Neck supple. No thyromegaly present.  Cardiovascular: Normal rate and regular rhythm.   Pulmonary/Chest: Breath sounds normal. No respiratory distress. She has no wheezes.  Abdominal: Soft. Bowel sounds are normal. There is no tenderness.    Musculoskeletal: She exhibits no edema or tenderness.  Lymphadenopathy:    She has no cervical adenopathy.  Skin: No rash noted. No erythema.  Psychiatric: She has a normal mood and affect. Her behavior is normal.    BP 96/60   Pulse 80   Temp 98.1 F (36.7 C) (Oral)   Ht 5\' 2"  (1.575 m)   Wt 142 lb 3.2 oz (64.5 kg)   LMP 06/26/2009   SpO2 98%   BMI 26.01 kg/m  Wt Readings from Last 3 Encounters:  09/08/16 142 lb 3.2 oz (64.5 kg)  07/05/16 140 lb 12.8 oz (63.9 kg)  02/29/16 142 lb 3.2 oz (64.5 kg)     Lab Results  Component Value Date   WBC 7.7 11/24/2015   HGB 13.4 11/24/2015   HCT 39.2 11/24/2015   PLT 337.0 11/24/2015   GLUCOSE 97 07/27/2016   CHOL 246 (H) 11/24/2015   TRIG 203.0 (H) 11/24/2015   HDL 46.70 11/24/2015   LDLDIRECT 152.0 11/24/2015   LDLCALC 171 (H) 09/29/2014   ALT 22 02/29/2016   AST 17 02/29/2016   NA 139 07/27/2016   K 4.2 07/27/2016   CL 106 07/27/2016   CREATININE 0.81 07/27/2016   BUN 9 07/27/2016   CO2 26 07/27/2016   TSH 1.05 11/24/2015    US Breast Aspiration Left  Result Date: 01/12/2016 CLINICAL DATA:  51 year old female for aspiration of a symptomatic left breast cyst at 3 o'clock, 2 cm from the nipple EXAM: ULTRASOUND GUIDED LEFT BREAST CYST ASPIRATION COMPARISON:  Previous exams. PROCEDURE: Using sterile technique, 1% lidocaine, under direct ultrasound visualization, needle aspiration of a left breast cyst at 3 o'clock, 2 cm from the nipple was performed. The cyst completely collapsed following aspiration yielding approximately 60 cc of nonbloody, greenish fluid. IMPRESSION: Ultrasound-guided aspiration of a left breast cyst at 3 o'clock, 2 cm from the nipple. No apparent complications. RECOMMENDATIONS: Screening mammogram in one year.(Code:SM-B-01Y) Electronically Signed   By: Dalphine Handing M.D.   On: 01/12/2016 13:48       Assessment & Plan:   Problem List Items Addressed This Visit    Essential hypertension    Blood pressure  has been doing well per her report.  Cough.  Will stop lisinopril.  Start losartan 25mg  q day.  Follow pressures.  Follow metabolic panel.        Relevant Medications   losartan (COZAAR) 25 MG tablet   GERD (gastroesophageal reflux disease)    Controlled on omeprazole.        Hypercholesterolemia    Low cholesterol diet and exercise.  Unable to take statin medication secondary to elevated liver function tests.  Follow lipid panel.        Relevant Medications   losartan (COZAAR) 25 MG tablet   Hypothyroidism    On thyroid replacement.  Follow tsh.        Sleep disturbance  On trazodone.  Follow.        Stress    Overall handling things relatively well.  Follow.        Travel advice encounter    Has zpak to take.  Up to date with hepatitis vaccine and tetanus.  States needs typhoid.  Tried to refer her to travel clinic.  She is leaving in one week.  Oral typhoid vaccine prescribed.            Dale DurhamSCOTT, Jerusalem Brownstein, MD

## 2016-09-08 NOTE — Telephone Encounter (Signed)
Dr. Lorin PicketScott talked with Infectious disease and has updated information will discuss with patient at OV.

## 2016-09-08 NOTE — Progress Notes (Signed)
Pre visit review using our clinic review tool, if applicable. No additional management support is needed unless otherwise documented below in the visit note. 

## 2016-09-08 NOTE — Telephone Encounter (Signed)
After speaking with another physician, she should be able to get in the local travel clinic [facility] in time.  We do not do travel medicine.  See me about this.

## 2016-09-10 ENCOUNTER — Encounter: Payer: Self-pay | Admitting: Internal Medicine

## 2016-09-10 NOTE — Assessment & Plan Note (Signed)
On trazodone.  Follow.  

## 2016-09-10 NOTE — Assessment & Plan Note (Signed)
Blood pressure has been doing well per her report.  Cough.  Will stop lisinopril.  Start losartan 25mg  q day.  Follow pressures.  Follow metabolic panel.

## 2016-09-10 NOTE — Assessment & Plan Note (Signed)
On thyroid replacement.  Follow tsh.  

## 2016-09-10 NOTE — Assessment & Plan Note (Signed)
Has zpak to take.  Up to date with hepatitis vaccine and tetanus.  States needs typhoid.  Tried to refer her to travel clinic.  She is leaving in one week.  Oral typhoid vaccine prescribed.

## 2016-09-10 NOTE — Assessment & Plan Note (Signed)
Controlled on omeprazole.   

## 2016-09-10 NOTE — Assessment & Plan Note (Signed)
Overall handling things relatively well.  Follow.  

## 2016-09-10 NOTE — Assessment & Plan Note (Signed)
Low cholesterol diet and exercise.  Unable to take statin medication secondary to elevated liver function tests.  Follow lipid panel.

## 2016-09-11 NOTE — Telephone Encounter (Signed)
Left message to return call to our office.  

## 2016-09-12 ENCOUNTER — Other Ambulatory Visit: Payer: Self-pay

## 2016-09-12 MED ORDER — CYCLOBENZAPRINE HCL 10 MG PO TABS: 10.0000 mg | ORAL_TABLET | Freq: Every evening | ORAL | 2 refills | Status: DC | PRN

## 2016-09-12 NOTE — Telephone Encounter (Signed)
Left message to return call to our office. Have called patient x 3 has not returned call. Need more information for Dr. Lorin PicketScott before she will fill. If patient calls back we will need to pass info requested by Dr. Lorin PicketScott.

## 2016-11-13 ENCOUNTER — Telehealth: Payer: Self-pay | Admitting: Internal Medicine

## 2016-11-13 DIAGNOSIS — E039 Hypothyroidism, unspecified: Secondary | ICD-10-CM

## 2016-11-13 DIAGNOSIS — R748 Abnormal levels of other serum enzymes: Secondary | ICD-10-CM

## 2016-11-13 DIAGNOSIS — I10 Essential (primary) hypertension: Secondary | ICD-10-CM

## 2016-11-13 DIAGNOSIS — E7800 Pure hypercholesterolemia, unspecified: Secondary | ICD-10-CM

## 2016-11-13 NOTE — Telephone Encounter (Signed)
Appointment For: [facility] ([id number])  Visit Type: MYCHART PHYSICAL (1065)    02/27/2017  2:00 PM 30 mins. Dale , MD    [facility]    Patient Comments:  Annual Physical  Annual physical. I thought we had scheduled   appointments for August. I am supposed to get my lab   work completed prior to my physical.    Need lab orders.

## 2016-11-16 NOTE — Telephone Encounter (Signed)
Next appt is in December, Please advise if you need labs prior or need to see prior to December. thanks

## 2016-11-17 NOTE — Telephone Encounter (Signed)
Spoke with team lead and we agreed to open the 830 slot on the 17th for the patient.  Scheduled and attempted to reach patient to review and then schedule fasting labs the week before.  Left a VM to return my call. thanks

## 2016-11-17 NOTE — Telephone Encounter (Signed)
Can schedule cpe 11:30 on 12/15/16.  I have placed order for labs.  Please schedule her for fasting lab appt 1-2 days before physical.

## 2016-11-17 NOTE — Telephone Encounter (Signed)
Spoke with the patient and she was not able to do the 21st as her daughter is flying in to get married that day.  I told her I would call her back.

## 2016-12-04 ENCOUNTER — Other Ambulatory Visit: Payer: Self-pay | Admitting: Internal Medicine

## 2016-12-05 ENCOUNTER — Other Ambulatory Visit: Payer: Self-pay | Admitting: Internal Medicine

## 2016-12-11 ENCOUNTER — Other Ambulatory Visit (HOSPITAL_COMMUNITY)
Admission: RE | Admit: 2016-12-11 | Discharge: 2016-12-11 | Disposition: A | Discharge status: Home / Self Care | Payer: 59 | Source: Ambulatory Visit | Attending: Internal Medicine | Admitting: Internal Medicine

## 2016-12-11 ENCOUNTER — Ambulatory Visit (INDEPENDENT_AMBULATORY_CARE_PROVIDER_SITE_OTHER): Payer: 59 | Admitting: Internal Medicine

## 2016-12-11 ENCOUNTER — Encounter: Payer: Self-pay | Admitting: Internal Medicine

## 2016-12-11 VITALS — BP 110/64 | HR 63 | Temp 98.6°F | Resp 14 | Ht 62.0 in | Wt 144.0 lb

## 2016-12-11 DIAGNOSIS — Z1231 Encounter for screening mammogram for malignant neoplasm of breast: Secondary | ICD-10-CM | POA: Diagnosis not present

## 2016-12-11 DIAGNOSIS — Z8249 Family history of ischemic heart disease and other diseases of the circulatory system: Secondary | ICD-10-CM

## 2016-12-11 DIAGNOSIS — G479 Sleep disorder, unspecified: Secondary | ICD-10-CM | POA: Diagnosis not present

## 2016-12-11 DIAGNOSIS — K219 Gastro-esophageal reflux disease without esophagitis: Secondary | ICD-10-CM | POA: Diagnosis not present

## 2016-12-11 DIAGNOSIS — F439 Reaction to severe stress, unspecified: Secondary | ICD-10-CM | POA: Diagnosis not present

## 2016-12-11 DIAGNOSIS — E039 Hypothyroidism, unspecified: Secondary | ICD-10-CM

## 2016-12-11 DIAGNOSIS — Z124 Encounter for screening for malignant neoplasm of cervix: Secondary | ICD-10-CM | POA: Insufficient documentation

## 2016-12-11 DIAGNOSIS — E7800 Pure hypercholesterolemia, unspecified: Secondary | ICD-10-CM

## 2016-12-11 DIAGNOSIS — I10 Essential (primary) hypertension: Secondary | ICD-10-CM | POA: Diagnosis not present

## 2016-12-11 DIAGNOSIS — R748 Abnormal levels of other serum enzymes: Secondary | ICD-10-CM

## 2016-12-11 DIAGNOSIS — Z0000 Encounter for general adult medical examination without abnormal findings: Secondary | ICD-10-CM

## 2016-12-11 DIAGNOSIS — Z1239 Encounter for other screening for malignant neoplasm of breast: Secondary | ICD-10-CM

## 2016-12-11 LAB — CBC WITH DIFFERENTIAL/PLATELET
BASOS PCT: 0.7 % (ref 0.0–3.0)
Basophils Absolute: 0.1 10*3/uL (ref 0.0–0.1)
EOS PCT: 1.5 % (ref 0.0–5.0)
Eosinophils Absolute: 0.1 10*3/uL (ref 0.0–0.7)
HCT: 39.8 % (ref 36.0–46.0)
Hemoglobin: 13.3 g/dL (ref 12.0–15.0)
LYMPHS ABS: 2.2 10*3/uL (ref 0.7–4.0)
Lymphocytes Relative: 28.1 % (ref 12.0–46.0)
MCHC: 33.5 g/dL (ref 30.0–36.0)
MCV: 89.9 fl (ref 78.0–100.0)
MONOS PCT: 5.9 % (ref 3.0–12.0)
Monocytes Absolute: 0.5 10*3/uL (ref 0.1–1.0)
NEUTROS PCT: 63.8 % (ref 43.0–77.0)
Neutro Abs: 4.9 10*3/uL (ref 1.4–7.7)
PLATELETS: 327 10*3/uL (ref 150.0–400.0)
RBC: 4.42 Mil/uL (ref 3.87–5.11)
RDW: 13.2 % (ref 11.5–15.5)
WBC: 7.7 10*3/uL (ref 4.0–10.5)

## 2016-12-11 LAB — HEPATIC FUNCTION PANEL
ALBUMIN: 4.4 g/dL (ref 3.5–5.2)
ALT: 33 U/L (ref 0–35)
AST: 26 U/L (ref 0–37)
Alkaline Phosphatase: 78 U/L (ref 39–117)
Bilirubin, Direct: 0 mg/dL (ref 0.0–0.3)
Total Bilirubin: 0.3 mg/dL (ref 0.2–1.2)
Total Protein: 7.6 g/dL (ref 6.0–8.3)

## 2016-12-11 LAB — BASIC METABOLIC PANEL
BUN: 11 mg/dL (ref 6–23)
CHLORIDE: 105 meq/L (ref 96–112)
CO2: 27 meq/L (ref 19–32)
Calcium: 10 mg/dL (ref 8.4–10.5)
Creatinine, Ser: 0.85 mg/dL (ref 0.40–1.20)
GFR: 75.4 mL/min (ref >60.00)
Glucose, Bld: 91 mg/dL (ref 70–99)
Potassium: 4.3 mEq/L (ref 3.5–5.1)
SODIUM: 139 meq/L (ref 135–145)

## 2016-12-11 LAB — LIPID PANEL
CHOLESTEROL: 291 mg/dL — AB (ref 0–200)
HDL: 49.3 mg/dL (ref >39.00)
Total CHOL/HDL Ratio: 6

## 2016-12-11 LAB — TSH: TSH: 1.67 u[IU]/mL (ref 0.35–4.50)

## 2016-12-11 LAB — LDL CHOLESTEROL, DIRECT: Direct LDL: 131 mg/dL

## 2016-12-11 NOTE — Assessment & Plan Note (Signed)
Controlled on omeprazole.   

## 2016-12-11 NOTE — Progress Notes (Signed)
[name], female   DOB: 05/14/1965, 51 y.o.   MRN: [id number]   Subjective:    [name], female    DOB: 1965-09-21, 51 y.o.   MRN: [id number]  HPI  Patient here for her physical exam.  She reports she is doing well.  Trying to stay active.  Discussed diet and exercise.  No chest pain. No sob.  She did have questions about her family history of CAD.  Discussed further cardiac monitoring.  Currently asymptomatic.  No acid reflux.  No abdominal pain. Bowels moving.  Previously was placed on statin medication.  Had elevated liver function tests which resolve with stopping the medication.     Past Medical History:  Diagnosis Date  . GERD (gastroesophageal reflux disease)   . Hypertension    Past Surgical History:  Procedure Laterality Date  . ABDOMINAL HYSTERECTOMY  2011  . BREAST BIOPSY Left 12/26/2007   Dr Evette Cristal- neg  . CHOLECYSTECTOMY  03/2012   Dr Malissa Hippo  . UPPER GI ENDOSCOPY     upper Endoscopy (10-15-2010)    Family History  Problem Relation Age of Onset  . Hypertension Mother   . Breast cancer Mother 60       negative genetic testing  . Heart block Father   . Heart disease Maternal Grandmother   . Stroke Maternal Grandmother   . Hypertension Maternal Grandmother   . Heart disease Maternal Grandfather   . Stroke Maternal Grandfather   . Hypertension Maternal Grandfather   . Diabetes Paternal Grandfather    Social History   Social History  . Marital status: Married    Spouse name: Jonny Ruiz  . Number of children: 2  . Years of education: N/A   Occupational History  .  [employer]   Social History Main Topics  . Smoking status: Former Games developer  . Smokeless tobacco: Never Used     Comment: smoked in college  . Alcohol use No  . Drug use: No  . Sexual activity: Not Asked   Other Topics Concern  . None   Social History Narrative  . None    Outpatient Encounter Prescriptions as of 12/11/2016  Medication Sig  .  cyclobenzaprine (FLEXERIL) 10 MG tablet Take 1 tablet (10 mg total) by mouth at bedtime as needed for muscle spasms.  Marland Kitchen levothyroxine (SYNTHROID, LEVOTHROID) 100 MCG tablet Take 1 tablet (100 mcg total) by mouth daily before breakfast.  . omeprazole (PRILOSEC) 20 MG capsule Take 20 mg by mouth daily.  . traZODone (DESYREL) 50 MG tablet TAKE 2 TABLETS BY MOUTH AT BEDTIME  . [DISCONTINUED] losartan (COZAAR) 25 MG tablet TAKE 1 TABLET BY MOUTH EVERY DAY  . losartan (COZAAR) 25 MG tablet Take 25 mg by mouth daily.  . [DISCONTINUED] typhoid (VIVOTIF) DR capsule Take 1 capsule by mouth every other day.   No facility-administered encounter medications on file as of 12/11/2016.     Review of Systems  Constitutional: Negative for appetite change and unexpected weight change.  HENT: Negative for congestion and sinus pressure.   Eyes: Negative for pain and visual disturbance.  Respiratory: Negative for cough, chest tightness and shortness of breath.   Cardiovascular: Negative for chest pain, palpitations and leg swelling.  Gastrointestinal: Negative for abdominal pain, diarrhea, nausea and vomiting.  Genitourinary: Negative for difficulty urinating and dysuria.  Musculoskeletal: Negative for back pain and joint swelling.  Skin: Negative for color change and rash.  Neurological: Negative for dizziness, light-headedness and  headaches.  Hematological: Negative for adenopathy. Does not bruise/bleed easily.  Psychiatric/Behavioral: Negative for agitation and dysphoric mood.       Objective:    Physical Exam  Constitutional: She is oriented to person, place, and time. She appears well-developed and well-nourished. No distress.  HENT:  Nose: Nose normal.  Mouth/Throat: Oropharynx is clear and moist.  Eyes: Right eye exhibits no discharge. Left eye exhibits no discharge. No scleral icterus.  Neck: Neck supple. No thyromegaly present.  Cardiovascular: Normal rate and regular rhythm.     Pulmonary/Chest: Breath sounds normal. No accessory muscle usage. No tachypnea. No respiratory distress. She has no decreased breath sounds. She has no wheezes. She has no rhonchi. Right breast exhibits no inverted nipple, no mass, no nipple discharge and no tenderness (no axillary adenopathy). Left breast exhibits no inverted nipple, no mass, no nipple discharge and no tenderness (no axilarry adenopathy).  Abdominal: Soft. Bowel sounds are normal. There is no tenderness.  Genitourinary:  Genitourinary Comments: Normal external genitalia.  Vaginal vault without lesions. She is s/p hysterectomy, but still has cervix.  Cervix identified.  Pap smear performed.  Could not appreciate any adnexal masses or tenderness.    Musculoskeletal: She exhibits no edema or tenderness.  Lymphadenopathy:    She has no cervical adenopathy.  Neurological: She is alert and oriented to person, place, and time.  Skin: Skin is warm. No rash noted. No erythema.  Psychiatric: She has a normal mood and affect. Her behavior is normal.    BP 110/64 (BP Location: Left Arm, Patient Position: Sitting, Cuff Size: Normal)   Pulse 63   Temp 98.6 F (37 C) (Oral)   Resp 14   Ht  (1.575 m)   Wt 144 lb (65.3 kg)   LMP 06/26/2009   SpO2 97%   BMI 26.34 kg/m  Wt Readings from Last 3 Encounters:  12/11/16 144 lb (65.3 kg)  09/08/16 142 lb 3.2 oz (64.5 kg)  07/05/16 140 lb 12.8 oz (63.9 kg)     Lab Results  Component Value Date   WBC 7.7 12/11/2016   HGB 13.3 12/11/2016   HCT 39.8 12/11/2016   PLT 327.0 12/11/2016   GLUCOSE 91 12/11/2016   CHOL 291 (H) 12/11/2016   TRIG (H) 12/11/2016    401.0 Triglyceride is over 400; calculations on Lipids are invalid.   HDL 49.30 12/11/2016   LDLDIRECT 131.0 12/11/2016   LDLCALC 171 (H) 09/29/2014   ALT 33 12/11/2016   AST 26 12/11/2016   NA 139 12/11/2016   K 4.3 12/11/2016   CL 105 12/11/2016   CREATININE 0.85 12/11/2016   BUN 11 12/11/2016   CO2 27 12/11/2016    TSH 1.67 12/11/2016    US Breast Aspiration Left  Result Date: 01/12/2016 CLINICAL DATA:  51 year old female for aspiration of a symptomatic left breast cyst at 3 o'clock, 2 cm from the nipple EXAM: ULTRASOUND GUIDED LEFT BREAST CYST ASPIRATION COMPARISON:  Previous exams. PROCEDURE: Using sterile technique, 1% lidocaine, under direct ultrasound visualization, needle aspiration of a left breast cyst at 3 o'clock, 2 cm from the nipple was performed. The cyst completely collapsed following aspiration yielding approximately 60 cc of nonbloody, greenish fluid. IMPRESSION: Ultrasound-guided aspiration of a left breast cyst at 3 o'clock, 2 cm from the nipple. No apparent complications. RECOMMENDATIONS: Screening mammogram in one year.(Code:SM-B-01Y) Electronically Signed   By: Dalphine Handing M.D.   On: 01/12/2016 13:48       Assessment & Plan:   Problem  List Items Addressed This Visit    Abnormal liver enzymes    Had elevation after started on cholesterol medication.  Recheck liver panel.       Essential hypertension    Blood pressure under good control.  Continue same medication regimen.  Follow pressures.  Follow metabolic panel.        Relevant Medications   losartan (COZAAR) 25 MG tablet   Family history of early CAD    Brother and father recently diagnosed with CAD.  She is concerned regarding her risk.  She is currently asymptomatic.  Discussed risk factor modification.  Discussed referral to cardiology, calcium scoring, etc.  She will notify me if desires further evaluation.        GERD (gastroesophageal reflux disease)    Controlled on omeprazole.        Health care maintenance    Physical today 12/11/16.  PAP 09/02/12 negative with negative HPV.  She is s/p hysterectomy.  Still has cervix.  PAP repeat today.  Discussed the need for upcoming colonoscopy - age 79.        Hypercholesterolemia    Had elevated liver function tests with previous statin.  Low cholesterol diet and exercise.   Follow lipid panel.        Relevant Medications   losartan (COZAAR) 25 MG tablet   Hypothyroidism    On thyroid replacement.  Follow tsh.       Sleep disturbance    On trazodone.  Stable.       Stress    Some increased stress with work issues.  Overall she feels she is handling things relatively well.  Follow.         Other Visit Diagnoses    Screening for cervical cancer    -  Primary   Relevant Orders   Cytology - PAP   Screening for breast cancer       Relevant Orders   MM DIGITAL SCREENING BILATERAL       Dale [place], MD

## 2016-12-11 NOTE — Assessment & Plan Note (Signed)
Some increased stress with work issues.  Overall she feels she is handling things relatively well.  Follow.

## 2016-12-11 NOTE — Assessment & Plan Note (Addendum)
Physical today 12/11/16.  PAP 09/02/12 negative with negative HPV.  She is s/p hysterectomy.  Still has cervix.  PAP repeat today.  Discussed the need for upcoming colonoscopy - age 51.

## 2016-12-12 ENCOUNTER — Encounter: Payer: Self-pay | Admitting: Internal Medicine

## 2016-12-12 LAB — CYTOLOGY - PAP
Diagnosis: NEGATIVE
HPV: NOT DETECTED

## 2016-12-12 NOTE — Telephone Encounter (Signed)
Mailed copied of [facility] lipid diet. thanks

## 2016-12-12 NOTE — Assessment & Plan Note (Signed)
Blood pressure under good control.  Continue same medication regimen.  Follow pressures.  Follow metabolic panel.   

## 2016-12-12 NOTE — Telephone Encounter (Signed)
Addressed in lab result note.  See result note.

## 2016-12-12 NOTE — Assessment & Plan Note (Signed)
On thyroid replacement.  Follow tsh.  

## 2016-12-12 NOTE — Assessment & Plan Note (Signed)
Had elevation after started on cholesterol medication.  Recheck liver panel.

## 2016-12-12 NOTE — Assessment & Plan Note (Signed)
Had elevated liver function tests with previous statin.  Low cholesterol diet and exercise.  Follow lipid panel.

## 2016-12-12 NOTE — Assessment & Plan Note (Signed)
Brother and father recently diagnosed with CAD.  She is concerned regarding her risk.  She is currently asymptomatic.  Discussed risk factor modification.  Discussed referral to cardiology, calcium scoring, etc.  She will notify me if desires further evaluation.

## 2016-12-12 NOTE — Assessment & Plan Note (Signed)
On trazodone.  Stable.  

## 2016-12-20 ENCOUNTER — Ambulatory Visit: Payer: 59

## 2017-01-02 ENCOUNTER — Ambulatory Visit
Admission: RE | Admit: 2017-01-02 | Discharge: 2017-01-02 | Disposition: A | Discharge status: Home / Self Care | Payer: 59 | Source: Ambulatory Visit | Attending: Internal Medicine | Admitting: Internal Medicine

## 2017-01-02 DIAGNOSIS — Z1231 Encounter for screening mammogram for malignant neoplasm of breast: Secondary | ICD-10-CM | POA: Insufficient documentation

## 2017-01-02 DIAGNOSIS — Z1239 Encounter for other screening for malignant neoplasm of breast: Secondary | ICD-10-CM

## 2017-01-31 ENCOUNTER — Other Ambulatory Visit: Payer: Self-pay | Admitting: Internal Medicine

## 2017-02-01 NOTE — Telephone Encounter (Signed)
Last o/v 12/11/16 F/u 03/09/17 Last script for Synthroid 07/05/16 Last script for Flexeril 09/12/16

## 2017-02-27 ENCOUNTER — Encounter: Payer: Self-pay | Admitting: Internal Medicine

## 2017-03-10 ENCOUNTER — Other Ambulatory Visit: Payer: Self-pay | Admitting: Internal Medicine

## 2017-05-19 ENCOUNTER — Other Ambulatory Visit: Payer: Self-pay | Admitting: Internal Medicine

## 2017-06-18 ENCOUNTER — Other Ambulatory Visit: Payer: Self-pay | Admitting: Internal Medicine

## 2017-06-20 NOTE — Telephone Encounter (Signed)
Last OV 12/11/2016   Next OV 06/21/2017 Last refill 02/01/2017 with 2 refills

## 2017-06-21 ENCOUNTER — Encounter: Payer: 59 | Admitting: Internal Medicine

## 2017-06-21 NOTE — Telephone Encounter (Signed)
LMTCB. Patient is coming in today

## 2017-06-21 NOTE — Telephone Encounter (Signed)
I do not mind refilling the medication, but need a little more information.  Need to confirm pt doing ok.  How often needing?  Any change in symptoms?

## 2017-06-22 NOTE — Telephone Encounter (Signed)
LMTCB

## 2017-06-22 NOTE — Telephone Encounter (Signed)
My Chart message sent

## 2017-07-05 MED ORDER — CYCLOBENZAPRINE HCL 10 MG PO TABS: 10.0000 mg | ORAL_TABLET | Freq: Every evening | ORAL | 0 refills | Status: DC | PRN

## 2017-07-05 NOTE — Telephone Encounter (Signed)
ok'd rx for  flexeril #30 with no refills.

## 2017-07-22 ENCOUNTER — Other Ambulatory Visit: Payer: Self-pay | Admitting: Internal Medicine

## 2017-08-17 ENCOUNTER — Other Ambulatory Visit: Payer: Self-pay | Admitting: Internal Medicine

## 2017-09-12 ENCOUNTER — Other Ambulatory Visit: Payer: Self-pay | Admitting: Internal Medicine

## 2017-09-28 ENCOUNTER — Ambulatory Visit (INDEPENDENT_AMBULATORY_CARE_PROVIDER_SITE_OTHER): Payer: Managed Care, Other (non HMO) | Admitting: Internal Medicine

## 2017-09-28 ENCOUNTER — Encounter: Payer: Self-pay | Admitting: Internal Medicine

## 2017-09-28 DIAGNOSIS — Z8249 Family history of ischemic heart disease and other diseases of the circulatory system: Secondary | ICD-10-CM

## 2017-09-28 DIAGNOSIS — R748 Abnormal levels of other serum enzymes: Secondary | ICD-10-CM | POA: Diagnosis not present

## 2017-09-28 DIAGNOSIS — I10 Essential (primary) hypertension: Secondary | ICD-10-CM

## 2017-09-28 DIAGNOSIS — K219 Gastro-esophageal reflux disease without esophagitis: Secondary | ICD-10-CM | POA: Diagnosis not present

## 2017-09-28 DIAGNOSIS — E039 Hypothyroidism, unspecified: Secondary | ICD-10-CM | POA: Diagnosis not present

## 2017-09-28 DIAGNOSIS — F439 Reaction to severe stress, unspecified: Secondary | ICD-10-CM

## 2017-09-28 DIAGNOSIS — E7800 Pure hypercholesterolemia, unspecified: Secondary | ICD-10-CM | POA: Diagnosis not present

## 2017-09-28 NOTE — Progress Notes (Signed)
[name], female   DOB: March 17, 1966, 52 y.o.   MRN: [id number]   Subjective:    [name], female    DOB: May 05, 1965, 52 y.o.   MRN: [id number]  HPI  Patient here for a scheduled follow up.  Originally scheduled for a physical, but not due physical.   Has been doing relatively well.  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  Office is now at [facility].  Some increased stress with this transition.  A lot of people have left.  Overall she feel she feels she is handling things relatively well.  Does not feel she needs any furhter intervention.  Trying to stay active.  Going to the gym.  States trying to go 30-40 minutes 3-4x/week.  No chest pain.  No sob.  No acid reflux.  No abdominal pain.  Bowels moving.  Frustrated she cannot lose weight.  Trying to watch her diet.  Discussed diet and exercise.      Past Medical History:  Diagnosis Date  . GERD (gastroesophageal reflux disease)   . Hypertension    Past Surgical History:  Procedure Laterality Date  . ABDOMINAL HYSTERECTOMY  2011  . BREAST BIOPSY Left 12/26/2007   Dr Evette Cristal- neg  . CHOLECYSTECTOMY  03/2012   Dr Malissa Hippo  . UPPER GI ENDOSCOPY     upper Endoscopy (10-15-2010)    Family History  Problem Relation Age of Onset  . Hypertension Mother   . Breast cancer Mother 75       negative genetic testing  . Heart block Father   . Heart disease Maternal Grandmother   . Stroke Maternal Grandmother   . Hypertension Maternal Grandmother   . Heart disease Maternal Grandfather   . Stroke Maternal Grandfather   . Hypertension Maternal Grandfather   . Diabetes Paternal Grandfather    Social History   Socioeconomic History  . Marital status: Married    Spouse name: Jonny Ruiz  . Number of children: 2  . Years of education: Not on file  . Highest education level: Not on file  Occupational History    Employer: [employer]  Social Needs  . Financial resource strain: Not on file  . Food insecurity:    Worry: Not on  file    Inability: Not on file  . Transportation needs:    Medical: Not on file    Non-medical: Not on file  Tobacco Use  . Smoking status: Former Games developer  . Smokeless tobacco: Never Used  . Tobacco comment: smoked in college  Substance and Sexual Activity  . Alcohol use: No    Alcohol/week: 0.0 oz  . Drug use: No  . Sexual activity: Not on file  Lifestyle  . Physical activity:    Days per week: Not on file    Minutes per session: Not on file  . Stress: Not on file  Relationships  . Social connections:    Talks on phone: Not on file    Gets together: Not on file    Attends religious service: Not on file    Active member of club or organization: Not on file    Attends meetings of clubs or organizations: Not on file    Relationship status: Not on file  Other Topics Concern  . Not on file  Social History Narrative  . Not on file    Outpatient Encounter Medications as of 09/28/2017  Medication Sig  . cyclobenzaprine (FLEXERIL) 10 MG tablet Take 1 tablet (10 mg total)  by mouth at bedtime as needed for muscle spasms.  Marland Kitchen levothyroxine (SYNTHROID, LEVOTHROID) 100 MCG tablet TAKE 1 TABLET (100 MCG TOTAL) BY MOUTH DAILY.  Marland Kitchen losartan (COZAAR) 25 MG tablet TAKE 1 TABLET BY MOUTH EVERY DAY  . omeprazole (PRILOSEC) 20 MG capsule Take 20 mg by mouth daily.  . traZODone (DESYREL) 50 MG tablet TAKE 2 TABLETS BY MOUTH AT BEDTIME  . [DISCONTINUED] levothyroxine (SYNTHROID, LEVOTHROID) 100 MCG tablet Take 1 tablet (100 mcg total) by mouth daily before breakfast.   No facility-administered encounter medications on file as of 09/28/2017.     Review of Systems  Constitutional: Negative for appetite change and unexpected weight change.  HENT: Negative for congestion and sinus pressure.   Eyes: Negative for pain and visual disturbance.  Respiratory: Negative for cough, chest tightness and shortness of breath.   Cardiovascular: Negative for chest pain, palpitations and leg swelling.    Gastrointestinal: Negative for abdominal pain, diarrhea, nausea and vomiting.  Genitourinary: Negative for difficulty urinating and dysuria.  Musculoskeletal: Negative for joint swelling and myalgias.  Skin: Negative for color change and rash.  Neurological: Negative for dizziness, light-headedness and headaches.  Hematological: Negative for adenopathy. Does not bruise/bleed easily.  Psychiatric/Behavioral: Negative for agitation and dysphoric mood.       Objective:    Physical Exam  Constitutional: She is oriented to person, place, and time. She appears well-developed and well-nourished. No distress.  HENT:  Nose: Nose normal.  Mouth/Throat: Oropharynx is clear and moist.  Eyes: Right eye exhibits no discharge. Left eye exhibits no discharge. No scleral icterus.  Neck: Neck supple. No thyromegaly present.  Cardiovascular: Normal rate and regular rhythm.  Pulmonary/Chest: Breath sounds normal. No accessory muscle usage. No tachypnea. No respiratory distress. She has no decreased breath sounds. She has no wheezes. She has no rhonchi. Right breast exhibits no inverted nipple, no mass, no nipple discharge and no tenderness (no axillary adenopathy). Left breast exhibits no inverted nipple, no mass, no nipple discharge and no tenderness (no axilarry adenopathy).  Abdominal: Soft. Bowel sounds are normal. There is no tenderness.  Musculoskeletal: She exhibits no edema or tenderness.  Lymphadenopathy:    She has no cervical adenopathy.  Neurological: She is alert and oriented to person, place, and time.  Skin: No rash noted. No erythema.  Psychiatric: She has a normal mood and affect. Her behavior is normal.    BP 118/78 (BP Location: Left Arm, Patient Position: Sitting, Cuff Size: Normal)   Pulse 76   Temp 97.6 F (36.4 C) (Oral)   Resp 16   Wt 144 lb (65.3 kg)   LMP 06/26/2009   SpO2 99%   BMI 26.34 kg/m  Wt Readings from Last 3 Encounters:  09/28/17 144 lb (65.3 kg)  12/11/16  144 lb (65.3 kg)  09/08/16 142 lb 3.2 oz (64.5 kg)     Lab Results  Component Value Date   WBC 7.7 12/11/2016   HGB 13.3 12/11/2016   HCT 39.8 12/11/2016   PLT 327.0 12/11/2016   GLUCOSE 91 12/11/2016   CHOL 291 (H) 12/11/2016   TRIG (H) 12/11/2016    401.0 Triglyceride is over 400; calculations on Lipids are invalid.   HDL 49.30 12/11/2016   LDLDIRECT 131.0 12/11/2016   LDLCALC 171 (H) 09/29/2014   ALT 33 12/11/2016   AST 26 12/11/2016   NA 139 12/11/2016   K 4.3 12/11/2016   CL 105 12/11/2016   CREATININE 0.85 12/11/2016   BUN 11 12/11/2016  CO2 27 12/11/2016   TSH 1.67 12/11/2016    Mm Screening Breast Tomo Bilateral  Result Date: 01/02/2017 CLINICAL DATA:  Screening. History of benign left breast biopsy in 2009. History of left breast cyst aspiration in 2017. EXAM: 2D DIGITAL SCREENING BILATERAL MAMMOGRAM WITH CAD AND ADJUNCT TOMO COMPARISON:  Previous exam(s). ACR Breast Density Category c: The breast tissue is heterogeneously dense, which may obscure small masses. FINDINGS: There are no findings suspicious for malignancy. Images were processed with CAD. IMPRESSION: No mammographic evidence of malignancy. A result letter of this screening mammogram will be mailed directly to the patient. RECOMMENDATION: Screening mammogram in one year. (Code:SM-B-01Y) BI-RADS CATEGORY  1: Negative. Electronically Signed   By: Bary RichardStan  Maynard M.D.   On: 01/02/2017 09:01       Assessment & Plan:   Problem List Items Addressed This Visit    Abnormal liver enzymes    Had elevation after starting cholesterol medication.  Follow liver panel.        Essential hypertension    Blood pressure under good control.  Continue same medication regimen.  Follow pressures.  Follow metabolic panel.        Relevant Orders   CBC with Differential/Platelet   TSH   Basic metabolic panel   Family history of early CAD    Brother and father diagnosed with CAD.  She is currently asymptomatic.  Continue  risk factor modification.        GERD (gastroesophageal reflux disease)    Controlled on omeprazole.        Hypercholesterolemia    Low cholesterol diet and exercise.  Follow lipid panel.        Relevant Orders   Hepatic function panel   Lipid panel   Hypothyroidism    On thyroid replacement.  Follow tsh.        Stress    Overall handling things relatively well. Does not feel needs anything more at this time.   Follow.            Dale DurhamSCOTT, Effa Yarrow, MD

## 2017-09-30 ENCOUNTER — Encounter: Payer: Self-pay | Admitting: Internal Medicine

## 2017-09-30 NOTE — Assessment & Plan Note (Signed)
Brother and father diagnosed with CAD.  She is currently asymptomatic.  Continue risk factor modification.

## 2017-09-30 NOTE — Assessment & Plan Note (Signed)
Blood pressure under good control.  Continue same medication regimen.  Follow pressures.  Follow metabolic panel.   

## 2017-09-30 NOTE — Assessment & Plan Note (Signed)
Overall handling things relatively well. Does not feel needs anything more at this time.   Follow.

## 2017-09-30 NOTE — Assessment & Plan Note (Addendum)
Had elevation after starting cholesterol medication.  Follow liver panel.

## 2017-09-30 NOTE — Assessment & Plan Note (Signed)
On thyroid replacement.  Follow tsh.  

## 2017-09-30 NOTE — Assessment & Plan Note (Signed)
Low cholesterol diet and exercise.  Follow lipid panel.   

## 2017-09-30 NOTE — Assessment & Plan Note (Signed)
Controlled on omeprazole.   

## 2017-12-04 ENCOUNTER — Other Ambulatory Visit: Payer: Self-pay | Admitting: Internal Medicine

## 2017-12-09 ENCOUNTER — Other Ambulatory Visit: Payer: Self-pay | Admitting: Internal Medicine

## 2018-01-02 ENCOUNTER — Other Ambulatory Visit: Payer: Managed Care, Other (non HMO)

## 2018-01-04 ENCOUNTER — Ambulatory Visit: Payer: Managed Care, Other (non HMO) | Admitting: Internal Medicine

## 2018-03-01 ENCOUNTER — Other Ambulatory Visit: Payer: Self-pay | Admitting: Internal Medicine

## 2018-03-01 NOTE — Telephone Encounter (Signed)
Lat fill 4/19 last OV 7/19

## 2018-03-05 ENCOUNTER — Other Ambulatory Visit: Payer: Self-pay | Admitting: Internal Medicine

## 2018-04-04 ENCOUNTER — Other Ambulatory Visit: Payer: Self-pay

## 2018-04-04 ENCOUNTER — Encounter: Payer: Self-pay | Admitting: Internal Medicine

## 2018-04-04 MED ORDER — CYCLOBENZAPRINE HCL 10 MG PO TABS: 10.0000 mg | ORAL_TABLET | Freq: Every evening | ORAL | 0 refills | Status: DC | PRN

## 2018-04-04 NOTE — Telephone Encounter (Signed)
Spoke with patient. She is able to go to [facility] if needed but I have scheduled an appt with you on 1/23, which is the day that she returns. Patient says that the pain is not unbearable. It worsens as the day goes on. She is okay to wait until then. I have sent in flexeril #30 and explained if pain becomes worse, she should not wait to be seen

## 2018-04-04 NOTE — Telephone Encounter (Signed)
LMTCB

## 2018-04-04 NOTE — Telephone Encounter (Signed)
I am ok to refill the flexeril, but if increased pain and problem, may need to be seen before her f/u appt with me.  Can she go to [facility] where she is?

## 2018-04-18 ENCOUNTER — Ambulatory Visit: Payer: Managed Care, Other (non HMO) | Admitting: Internal Medicine

## 2018-05-02 ENCOUNTER — Ambulatory Visit (INDEPENDENT_AMBULATORY_CARE_PROVIDER_SITE_OTHER): Payer: Managed Care, Other (non HMO) | Admitting: Internal Medicine

## 2018-05-02 ENCOUNTER — Encounter: Payer: Self-pay | Admitting: Internal Medicine

## 2018-05-02 ENCOUNTER — Encounter

## 2018-05-02 VITALS — BP 124/70 | HR 84 | Temp 98.2°F | Resp 16 | Ht 62.0 in | Wt 139.2 lb

## 2018-05-02 DIAGNOSIS — E7800 Pure hypercholesterolemia, unspecified: Secondary | ICD-10-CM

## 2018-05-02 DIAGNOSIS — E039 Hypothyroidism, unspecified: Secondary | ICD-10-CM

## 2018-05-02 DIAGNOSIS — K219 Gastro-esophageal reflux disease without esophagitis: Secondary | ICD-10-CM

## 2018-05-02 DIAGNOSIS — G479 Sleep disorder, unspecified: Secondary | ICD-10-CM

## 2018-05-02 DIAGNOSIS — Z0000 Encounter for general adult medical examination without abnormal findings: Secondary | ICD-10-CM

## 2018-05-02 DIAGNOSIS — I10 Essential (primary) hypertension: Secondary | ICD-10-CM | POA: Diagnosis not present

## 2018-05-02 DIAGNOSIS — Z1239 Encounter for other screening for malignant neoplasm of breast: Secondary | ICD-10-CM

## 2018-05-02 DIAGNOSIS — M542 Cervicalgia: Secondary | ICD-10-CM | POA: Diagnosis not present

## 2018-05-02 DIAGNOSIS — R748 Abnormal levels of other serum enzymes: Secondary | ICD-10-CM

## 2018-05-02 DIAGNOSIS — F439 Reaction to severe stress, unspecified: Secondary | ICD-10-CM

## 2018-05-02 LAB — LIPID PANEL
CHOLESTEROL: 279 mg/dL — AB (ref 0–200)
HDL: 38.5 mg/dL — ABNORMAL LOW (ref >39.00)
NonHDL: 240.14
Total CHOL/HDL Ratio: 7
Triglycerides: 270 mg/dL — ABNORMAL HIGH (ref 0.0–149.0)
VLDL: 54 mg/dL — ABNORMAL HIGH (ref 0.0–40.0)

## 2018-05-02 LAB — TSH: TSH: 0.18 u[IU]/mL — AB (ref 0.35–4.50)

## 2018-05-02 LAB — CBC WITH DIFFERENTIAL/PLATELET
BASOS ABS: 0 10*3/uL (ref 0.0–0.1)
Basophils Relative: 0.4 % (ref 0.0–3.0)
Eosinophils Absolute: 0.1 10*3/uL (ref 0.0–0.7)
Eosinophils Relative: 1.8 % (ref 0.0–5.0)
HCT: 39.1 % (ref 36.0–46.0)
Hemoglobin: 13.3 g/dL (ref 12.0–15.0)
LYMPHS PCT: 34.4 % (ref 12.0–46.0)
Lymphs Abs: 2.7 10*3/uL (ref 0.7–4.0)
MCHC: 34.1 g/dL (ref 30.0–36.0)
MCV: 89.4 fl (ref 78.0–100.0)
MONOS PCT: 6.1 % (ref 3.0–12.0)
Monocytes Absolute: 0.5 10*3/uL (ref 0.1–1.0)
Neutro Abs: 4.4 10*3/uL (ref 1.4–7.7)
Neutrophils Relative %: 57.3 % (ref 43.0–77.0)
PLATELETS: 341 10*3/uL (ref 150.0–400.0)
RBC: 4.37 Mil/uL (ref 3.87–5.11)
RDW: 13.2 % (ref 11.5–15.5)
WBC: 7.7 10*3/uL (ref 4.0–10.5)

## 2018-05-02 LAB — BASIC METABOLIC PANEL
BUN: 6 mg/dL (ref 6–23)
CO2: 30 mEq/L (ref 19–32)
Calcium: 9.7 mg/dL (ref 8.4–10.5)
Chloride: 105 mEq/L (ref 96–112)
Creatinine, Ser: 0.69 mg/dL (ref 0.40–1.20)
GFR: 89.74 mL/min (ref >60.00)
Glucose, Bld: 77 mg/dL (ref 70–99)
Potassium: 3.8 mEq/L (ref 3.5–5.1)
Sodium: 141 mEq/L (ref 135–145)

## 2018-05-02 LAB — HEPATIC FUNCTION PANEL
ALT: 26 U/L (ref 0–35)
AST: 18 U/L (ref 0–37)
Albumin: 4.5 g/dL (ref 3.5–5.2)
Alkaline Phosphatase: 70 U/L (ref 39–117)
Bilirubin, Direct: 0.1 mg/dL (ref 0.0–0.3)
TOTAL PROTEIN: 7.1 g/dL (ref 6.0–8.3)
Total Bilirubin: 0.3 mg/dL (ref 0.2–1.2)

## 2018-05-02 LAB — LDL CHOLESTEROL, DIRECT: Direct LDL: 169 mg/dL

## 2018-05-02 MED ORDER — METHYLPREDNISOLONE 4 MG PO TBPK: ORAL_TABLET | ORAL | 0 refills | Status: DC

## 2018-05-02 NOTE — Progress Notes (Signed)
Patient ID: Janace ArisKatharine Ann Finigan, female   DOB: 04-08-1965, 53 y.o.   MRN: [id number]   Subjective:    Patient ID: Janace ArisKatharine Ann Erion, female    DOB: 04-08-1965, 53 y.o.   MRN: [id number]  HPI  Patient here for her physical exam.  She reports she is doing relatively well.  Has noticed some persistent increased neck pain recently.  Has had neck pain for years.  Recently feels has worsened.  More constant.  Better in am.  Can't lie on her side.  Worse as day progresses.  Works on [employer].  Has tried adjusting position.  Worse with certain movement - tilting her head, etc.  Stops at her shoulder.  No pain radiating down her arm.  No numbness or tingling.  No headache, light headedness or dizziness.  Taking advil daily.  No vision change.  States otherwise doing well.  Did crack a tooth recently.  States was told she was grinding her teeth.  Has mouth guard now.  Overall she feels she is handling stress.     Past Medical History:  Diagnosis Date  . GERD (gastroesophageal reflux disease)   . Hypertension    Past Surgical History:  Procedure Laterality Date  . ABDOMINAL HYSTERECTOMY  2011  . BREAST BIOPSY Left 12/26/2007   Dr Evette CristalSankar- neg  . CHOLECYSTECTOMY  03/2012   Dr Malissa HippoW Smith  . UPPER GI ENDOSCOPY     upper Endoscopy (10-15-2010)    Family History  Problem Relation Age of Onset  . Hypertension Mother   . Breast cancer Mother [id number]       negative genetic testing  . Heart block Father   . Heart disease Maternal Grandmother   . Stroke Maternal Grandmother   . Hypertension Maternal Grandmother   . Heart disease Maternal Grandfather   . Stroke Maternal Grandfather   . Hypertension Maternal Grandfather   . Diabetes Paternal Grandfather    Social History   Socioeconomic History  . Marital status: Married    Spouse name: [name]  . Number of children: 2  . Years of education: Not on file  . Highest education level: Not on file  Occupational History    Employer: [employer]  Social  Needs  . Financial resource strain: Not on file  . Food insecurity:    Worry: Not on file    Inability: Not on file  . Transportation needs:    Medical: Not on file    Non-medical: Not on file  Tobacco Use  . Smoking status: Former Games developermoker  . Smokeless tobacco: Never Used  . Tobacco comment: smoked in college  Substance and Sexual Activity  . Alcohol use: No    Alcohol/week: 0.0 standard drinks  . Drug use: No  . Sexual activity: Not on file  Lifestyle  . Physical activity:    Days per week: Not on file    Minutes per session: Not on file  . Stress: Not on file  Relationships  . Social connections:    Talks on phone: Not on file    Gets together: Not on file    Attends religious service: Not on file    Active member of club or organization: Not on file    Attends meetings of clubs or organizations: Not on file    Relationship status: Not on file  Other Topics Concern  . Not on file  Social History Narrative  . Not on file    Outpatient Encounter Medications as of 05/02/2018  Medication Sig  . cyclobenzaprine (FLEXERIL) 10 MG tablet Take 1 tablet (10 mg total) by mouth at bedtime as needed for muscle spasms.  Marland Kitchen losartan (COZAAR) 25 MG tablet TAKE 1 TABLET BY MOUTH EVERY DAY  . methylPREDNISolone (MEDROL DOSEPAK) 4 MG TBPK tablet Medrol dose pack.  6 day taper.  . traZODone (DESYREL) 50 MG tablet TAKE 2 TABLETS BY MOUTH AT BEDTIME  . [DISCONTINUED] levothyroxine (SYNTHROID, LEVOTHROID) 100 MCG tablet TAKE 1 TABLET (100 MCG TOTAL) BY MOUTH DAILY.  . [DISCONTINUED] omeprazole (PRILOSEC) 20 MG capsule Take 20 mg by mouth daily.   No facility-administered encounter medications on file as of 05/02/2018.     Review of Systems  Constitutional: Negative for appetite change and unexpected weight change.  HENT: Negative for congestion and sinus pressure.   Eyes: Negative for pain and visual disturbance.  Respiratory: Negative for cough, chest tightness and shortness of breath.     Cardiovascular: Negative for chest pain, palpitations and leg swelling.  Gastrointestinal: Negative for abdominal pain, diarrhea, nausea and vomiting.  Genitourinary: Negative for difficulty urinating and dysuria.  Musculoskeletal: Positive for neck pain. Negative for joint swelling and myalgias.  Skin: Negative for color change and rash.  Neurological: Negative for dizziness, light-headedness and headaches.  Hematological: Negative for adenopathy. Does not bruise/bleed easily.  Psychiatric/Behavioral: Negative for agitation and dysphoric mood.       Objective:    Physical Exam Constitutional:      General: She is not in acute distress.    Appearance: Normal appearance. She is well-developed.  HENT:     Nose: Nose normal. No congestion.     Mouth/Throat:     Pharynx: No oropharyngeal exudate or posterior oropharyngeal erythema.  Eyes:     General: No scleral icterus.       Right eye: No discharge.        Left eye: No discharge.  Neck:     Musculoskeletal: Neck supple. No muscular tenderness.     Thyroid: No thyromegaly.  Cardiovascular:     Rate and Rhythm: Normal rate and regular rhythm.  Pulmonary:     Effort: No tachypnea, accessory muscle usage or respiratory distress.     Breath sounds: Normal breath sounds. No decreased breath sounds or wheezing.  Chest:     Breasts:        Right: No inverted nipple, mass, nipple discharge or tenderness (no axillary adenopathy).        Left: No inverted nipple, mass, nipple discharge or tenderness (no axilarry adenopathy).  Abdominal:     General: Bowel sounds are normal.     Palpations: Abdomen is soft.     Tenderness: There is no abdominal tenderness.  Musculoskeletal:        General: No swelling or tenderness.  Lymphadenopathy:     Cervical: No cervical adenopathy.  Skin:    Findings: No erythema or rash.  Neurological:     Mental Status: She is alert and oriented to person, place, and time.  Psychiatric:        Mood and  Affect: Mood normal.        Behavior: Behavior normal.     BP 124/70 (BP Location: Left Arm, Patient Position: Sitting, Cuff Size: Normal)   Pulse 84   Temp 98.2 F (36.8 C) (Oral)   Resp 16   Ht 5\' 2"  (1.575 m)   Wt 139 lb 3.2 oz (63.1 kg)   LMP 06/26/2009   SpO2 98%   BMI 25.46 kg/m  Wt Readings from Last 3 Encounters:  05/02/18 139 lb 3.2 oz (63.1 kg)  09/28/17 144 lb (65.3 kg)  12/11/16 144 lb (65.3 kg)     Lab Results  Component Value Date   WBC 7.7 05/02/2018   HGB 13.3 05/02/2018   HCT 39.1 05/02/2018   PLT 341.0 05/02/2018   GLUCOSE 77 05/02/2018   CHOL 279 (H) 05/02/2018   TRIG 270.0 (H) 05/02/2018   HDL 38.50 (L) 05/02/2018   LDLDIRECT 169.0 05/02/2018   LDLCALC 171 (H) 09/29/2014   ALT 26 05/02/2018   AST 18 05/02/2018   NA 141 05/02/2018   K 3.8 05/02/2018   CL 105 05/02/2018   CREATININE 0.69 05/02/2018   BUN 6 05/02/2018   CO2 30 05/02/2018   TSH 0.18 (L) 05/02/2018    Mm Screening Breast Tomo Bilateral  Result Date: 01/02/2017 CLINICAL DATA:  Screening. History of benign left breast biopsy in 2009. History of left breast cyst aspiration in 2017. EXAM: 2D DIGITAL SCREENING BILATERAL MAMMOGRAM WITH CAD AND ADJUNCT TOMO COMPARISON:  Previous exam(s). ACR Breast Density Category c: The breast tissue is heterogeneously dense, which may obscure small masses. FINDINGS: There are no findings suspicious for malignancy. Images were processed with CAD. IMPRESSION: No mammographic evidence of malignancy. A result letter of this screening mammogram will be mailed directly to the patient. RECOMMENDATION: Screening mammogram in one year. (Code:SM-B-01Y) BI-RADS CATEGORY  1: Negative. Electronically Signed   By: Bary RichardStan  Maynard M.D.   On: 01/02/2017 09:01       Assessment & Plan:   Problem List Items Addressed This Visit    Abnormal liver enzymes    Had elevation after starting cholesterol medication.  Off.  Follow liver function tests.        Essential  hypertension    Blood pressure under good control.  Continue same medication regimen.  Follow pressures.  Follow metabolic panel.        GERD (gastroesophageal reflux disease)    Controlled on current regimen.  Follow.        Health care maintenance    Physical today 05/02/18.  PAP 12/11/16 - negative with negative HPV.  Discussed the need for colonoscopy.        Hypercholesterolemia    Low cholesterol diet and exercise.  Follow lipid panel.       Hypothyroidism    On thyroid replacement.  Follow tsh.        Neck pain    Neck pain as outlined.  Had previous c-spine xray - ok.  Has flexeril.  Taking advil.  Worsened recently.  Likely msk.  Worsened with certain position changes and movements.  No pain down arm.  No numbness or tingling.  No vision change.  No headache, light headedness or dizziness.  Will give medrol dose pack.  Discussed with neurology.  F/u with neurology for further testing.  Hold on further scanning at this time.        Relevant Orders   Ambulatory referral to Neurology   Sleep disturbance    Stable on trazodone.       Stress    Discussed with her today.  Has been grinding her teeth.  Now using a mouth guard.  Does not feel needs anything more for the stress, etc. Follow.         Other Visit Diagnoses    Routine general medical examination at a health care facility    -  Primary   Breast cancer screening  Relevant Orders   MM 3D SCREEN BREAST BILATERAL   MM 3D SCREEN BREAST BILATERAL       Dale Amity, MD

## 2018-05-02 NOTE — Assessment & Plan Note (Signed)
Physical today 05/02/18.  PAP 12/11/16 - negative with negative HPV.  Discussed the need for colonoscopy.

## 2018-05-03 ENCOUNTER — Other Ambulatory Visit: Payer: Self-pay

## 2018-05-03 MED ORDER — LEVOTHYROXINE SODIUM 88 MCG PO TABS: 88.0000 ug | ORAL_TABLET | Freq: Every day | ORAL | 0 refills | Status: DC

## 2018-05-05 ENCOUNTER — Encounter: Payer: Self-pay | Admitting: Internal Medicine

## 2018-05-05 NOTE — Assessment & Plan Note (Signed)
Discussed with her today.  Has been grinding her teeth.  Now using a mouth guard.  Does not feel needs anything more for the stress, etc. Follow.

## 2018-05-05 NOTE — Assessment & Plan Note (Signed)
Blood pressure under good control.  Continue same medication regimen.  Follow pressures.  Follow metabolic panel.   

## 2018-05-05 NOTE — Assessment & Plan Note (Signed)
Controlled on current regimen.  Follow.  

## 2018-05-05 NOTE — Assessment & Plan Note (Signed)
Low cholesterol diet and exercise.  Follow lipid panel.   

## 2018-05-05 NOTE — Assessment & Plan Note (Signed)
Neck pain as outlined.  Had previous c-spine xray - ok.  Has flexeril.  Taking advil.  Worsened recently.  Likely msk.  Worsened with certain position changes and movements.  No pain down arm.  No numbness or tingling.  No vision change.  No headache, light headedness or dizziness.  Will give medrol dose pack.  Discussed with neurology.  F/u with neurology for further testing.  Hold on further scanning at this time.

## 2018-05-05 NOTE — Assessment & Plan Note (Signed)
Stable on trazodone 

## 2018-05-05 NOTE — Assessment & Plan Note (Signed)
Had elevation after starting cholesterol medication.  Off.  Follow liver function tests.

## 2018-05-05 NOTE — Assessment & Plan Note (Signed)
On thyroid replacement.  Follow tsh.  

## 2018-05-09 ENCOUNTER — Telehealth: Payer: Self-pay

## 2018-05-09 NOTE — Telephone Encounter (Signed)
-----   Message from Dale College, MD sent at 05/07/2018  5:06 AM EST ----- Regarding: schedule mammogram Needs mammogram scheduled.  Overdue.  Thanks.    Dr Lorin Picket

## 2018-05-09 NOTE — Telephone Encounter (Signed)
Per chart, pt scheduled for mammogram on 2/25 at 4:40

## 2018-05-20 ENCOUNTER — Encounter: Payer: Self-pay | Admitting: Internal Medicine

## 2018-05-20 ENCOUNTER — Other Ambulatory Visit: Payer: Self-pay | Admitting: Internal Medicine

## 2018-05-20 NOTE — Telephone Encounter (Signed)
Trazodone has not been filled per chart since 9/18 ok to fill?

## 2018-05-21 ENCOUNTER — Ambulatory Visit
Admission: RE | Admit: 2018-05-21 | Discharge: 2018-05-21 | Disposition: A | Discharge status: Home / Self Care | Payer: Managed Care, Other (non HMO) | Source: Ambulatory Visit | Attending: Internal Medicine | Admitting: Internal Medicine

## 2018-05-21 DIAGNOSIS — Z1231 Encounter for screening mammogram for malignant neoplasm of breast: Secondary | ICD-10-CM | POA: Insufficient documentation

## 2018-05-21 MED ORDER — TRAZODONE HCL 50 MG PO TABS: 100.0000 mg | ORAL_TABLET | Freq: Every day | ORAL | 2 refills | Status: DC

## 2018-05-21 MED ORDER — CYCLOBENZAPRINE HCL 10 MG PO TABS: 10.0000 mg | ORAL_TABLET | Freq: Every evening | ORAL | 0 refills | Status: DC | PRN

## 2018-05-21 NOTE — Telephone Encounter (Signed)
tx sent in for trazodone #60 with 2 refills.

## 2018-06-11 ENCOUNTER — Telehealth: Payer: Self-pay | Admitting: Radiology

## 2018-06-11 ENCOUNTER — Other Ambulatory Visit: Payer: Self-pay | Admitting: Internal Medicine

## 2018-06-11 DIAGNOSIS — E039 Hypothyroidism, unspecified: Secondary | ICD-10-CM

## 2018-06-11 NOTE — Telephone Encounter (Signed)
Pt coming in for labs tomorrow, please place future orders. Thank you.  

## 2018-06-11 NOTE — Telephone Encounter (Signed)
Order placed for f/u tsh.  

## 2018-06-11 NOTE — Progress Notes (Signed)
Order placed for f/u tsh.  

## 2018-06-12 ENCOUNTER — Other Ambulatory Visit: Payer: Self-pay | Admitting: Internal Medicine

## 2018-06-12 ENCOUNTER — Other Ambulatory Visit: Payer: Managed Care, Other (non HMO)

## 2018-06-19 ENCOUNTER — Other Ambulatory Visit: Payer: Self-pay | Admitting: Internal Medicine

## 2018-06-22 ENCOUNTER — Other Ambulatory Visit: Payer: Self-pay | Admitting: Internal Medicine

## 2018-07-17 ENCOUNTER — Other Ambulatory Visit: Payer: Self-pay | Admitting: Internal Medicine

## 2018-07-19 NOTE — Telephone Encounter (Signed)
I ok'd rx for flexeril #30 with no refills.  Please confirm pt doing ok.  Needs to schedule a f/u appt.

## 2018-07-19 NOTE — Telephone Encounter (Signed)
Last OV 05/02/2018  Last refilled 05/21/2018 disp 30 with no refills   Next OV no next appt scheduled   Sent to PCP for approval

## 2018-08-04 ENCOUNTER — Other Ambulatory Visit: Payer: Self-pay | Admitting: Internal Medicine

## 2018-08-06 NOTE — Telephone Encounter (Signed)
appt scheduled

## 2018-08-13 ENCOUNTER — Encounter: Payer: Self-pay | Admitting: Internal Medicine

## 2018-08-13 ENCOUNTER — Ambulatory Visit (INDEPENDENT_AMBULATORY_CARE_PROVIDER_SITE_OTHER): Payer: Managed Care, Other (non HMO) | Admitting: Internal Medicine

## 2018-08-13 ENCOUNTER — Other Ambulatory Visit: Payer: Self-pay

## 2018-08-13 DIAGNOSIS — F439 Reaction to severe stress, unspecified: Secondary | ICD-10-CM

## 2018-08-13 DIAGNOSIS — I10 Essential (primary) hypertension: Secondary | ICD-10-CM

## 2018-08-13 DIAGNOSIS — R748 Abnormal levels of other serum enzymes: Secondary | ICD-10-CM

## 2018-08-13 DIAGNOSIS — E039 Hypothyroidism, unspecified: Secondary | ICD-10-CM | POA: Diagnosis not present

## 2018-08-13 DIAGNOSIS — E7800 Pure hypercholesterolemia, unspecified: Secondary | ICD-10-CM | POA: Diagnosis not present

## 2018-08-13 DIAGNOSIS — M542 Cervicalgia: Secondary | ICD-10-CM

## 2018-08-13 NOTE — Progress Notes (Signed)
[name], female   DOB: Aug 15, 1965, 53 y.o.   MRN: [id number]   Virtual Visit via video Note  This visit type was conducted due to national recommendations for restrictions regarding the COVID-19 pandemic (e.g. social distancing).  This format is felt to be most appropriate for this patient at this time.  All issues noted in this document were discussed and addressed.  No physical exam was performed (except for noted visual exam findings with Video Visits).   I connected with Belenda Cruise "Kit" Tangredi by a video enabled telemedicine application and verified that I am speaking with the correct person using two identifiers. Location patient: home Location provider: work Persons participating in the virtual visit: patient, provider  I discussed the limitations, risks, security and privacy concerns of performing an evaluation and management service by video and the availability of in person appointments.  The patient expressed understanding and agreed to proceed.   Reason for visit: scheduled follow up.   HPI: She reports she is doing well.  Feels good.  Staying active.  Is in [place] currently.  Drove down to her daughter's house to take furniture.  Planning to drive back tomorrow.  Trying to stay in due to COVID restrictions.  Working from home.  No fever.  No cough, chest congestion or sob.  No acid reflux.  Eating and drinking well. Bowels moving.  Handling stress.  Neck is some better.  Was going to therapy.  Was stopped secondary to COVID restrictions.  Doing exercise at home.     ROS: See pertinent positives and negatives per HPI.  Past Medical History:  Diagnosis Date  . GERD (gastroesophageal reflux disease)   . Hypertension     Past Surgical History:  Procedure Laterality Date  . ABDOMINAL HYSTERECTOMY  2011  . BREAST BIOPSY Left 12/26/2007   Dr Jamal Collin- neg  . CHOLECYSTECTOMY  03/2012   Dr Nicholes Stairs  . UPPER GI ENDOSCOPY     upper Endoscopy (10-15-2010)     Family History  Problem Relation Age of Onset  . Hypertension Mother   . Breast cancer Mother 40       negative genetic testing  . Heart block Father   . Heart disease Maternal Grandmother   . Stroke Maternal Grandmother   . Hypertension Maternal Grandmother   . Heart disease Maternal Grandfather   . Stroke Maternal Grandfather   . Hypertension Maternal Grandfather   . Diabetes Paternal Grandfather   . Breast cancer Paternal Uncle 104    SOCIAL HX: reviewed.    Current Outpatient Medications:  .  cyclobenzaprine (FLEXERIL) 10 MG tablet, TAKE 1 TABLET BY MOUTH AT BEDTIME AS NEEDED FOR MUSCLE SPASMS, Disp: 30 tablet, Rfl: 0 .  levothyroxine (SYNTHROID, LEVOTHROID) 88 MCG tablet, Take 1 tablet (88 mcg total) by mouth daily before breakfast., Disp: 90 tablet, Rfl: 0 .  losartan (COZAAR) 25 MG tablet, TAKE 1 TABLET BY MOUTH EVERY DAY, Disp: 90 tablet, Rfl: 0 .  methylPREDNISolone (MEDROL DOSEPAK) 4 MG TBPK tablet, Medrol dose pack.  6 day taper., Disp: 21 tablet, Rfl: 0 .  traZODone (DESYREL) 50 MG tablet, TAKE 2 TABLETS (100 MG TOTAL) BY MOUTH AT BEDTIME., Disp: 180 tablet, Rfl: 1  EXAM:  VITALS per patient if applicable: 027/74  GENERAL: alert, oriented, appears well and in no acute distress  HEENT: atraumatic, conjunttiva clear, no obvious abnormalities on inspection of external nose and ears  NECK: normal movements of the head and neck  LUNGS: on  inspection no signs of respiratory distress, breathing rate appears normal, no obvious gross SOB, gasping or wheezing  CV: no obvious cyanosis  PSYCH/NEURO: pleasant and cooperative, no obvious depression or anxiety, speech and thought processing grossly intact  ASSESSMENT AND PLAN:  Discussed the following assessment and plan:  Abnormal liver enzymes  Essential hypertension  Hypercholesterolemia  Hypothyroidism, unspecified type  Neck pain  Stress  Abnormal liver enzymes Had elevation after starting cholesterol  medication.  Off now.  Follow liver function tests.    Essential hypertension Blood pressure has been under good control.  Continue same medication regimen.  Follow pressures.  Follow metabolic panel.    Hypercholesterolemia Low cholesterol diet and exercise.  Follow lipid panel.   Hypothyroidism On thyroid replacement.  Follow tsh.   Neck pain Was going to therapy.  Doing exercises at home now.  Neck is doing better.  Follow.    Stress Overall doing better.  Follow.      I discussed the assessment and treatment plan with the patient. The patient was provided an opportunity to ask questions and all were answered. The patient agreed with the plan and demonstrated an understanding of the instructions.   The patient was advised to call back or seek an in-person evaluation if the symptoms worsen or if the condition fails to improve as anticipated.    Einar Pheasant, MD

## 2018-08-17 ENCOUNTER — Encounter: Payer: Self-pay | Admitting: Internal Medicine

## 2018-08-17 NOTE — Assessment & Plan Note (Signed)
Was going to therapy.  Doing exercises at home now.  Neck is doing better.  Follow.

## 2018-08-17 NOTE — Assessment & Plan Note (Signed)
Overall doing better.  Follow.  

## 2018-08-17 NOTE — Assessment & Plan Note (Signed)
Blood pressure has been under good control.  Continue same medication regimen.  Follow pressures.  Follow metabolic panel.   

## 2018-08-17 NOTE — Assessment & Plan Note (Signed)
Had elevation after starting cholesterol medication.  Off now.  Follow liver function tests.   

## 2018-08-17 NOTE — Assessment & Plan Note (Signed)
On thyroid replacement.  Follow tsh.  

## 2018-08-17 NOTE — Assessment & Plan Note (Signed)
Low cholesterol diet and exercise.  Follow lipid panel.   

## 2018-09-11 ENCOUNTER — Other Ambulatory Visit: Payer: Self-pay | Admitting: Internal Medicine

## 2018-09-17 ENCOUNTER — Other Ambulatory Visit: Payer: Self-pay | Admitting: Internal Medicine

## 2018-09-22 ENCOUNTER — Other Ambulatory Visit: Payer: Self-pay | Admitting: Internal Medicine

## 2018-09-24 MED ORDER — LOSARTAN POTASSIUM 25 MG PO TABS: 25.0000 mg | ORAL_TABLET | Freq: Every day | ORAL | 3 refills | Status: DC

## 2018-10-07 ENCOUNTER — Other Ambulatory Visit: Payer: Self-pay | Admitting: Internal Medicine

## 2018-10-07 NOTE — Telephone Encounter (Signed)
Losartan is on back order and they are requesting an alternative.

## 2018-10-10 ENCOUNTER — Encounter: Payer: Self-pay | Admitting: Internal Medicine

## 2018-10-10 ENCOUNTER — Other Ambulatory Visit: Payer: Self-pay | Admitting: Internal Medicine

## 2018-10-10 MED ORDER — CYCLOBENZAPRINE HCL 10 MG PO TABS: 10.0000 mg | ORAL_TABLET | Freq: Every evening | ORAL | 0 refills | Status: DC | PRN

## 2018-10-13 ENCOUNTER — Other Ambulatory Visit: Payer: Self-pay | Admitting: Internal Medicine

## 2018-10-13 DIAGNOSIS — E7800 Pure hypercholesterolemia, unspecified: Secondary | ICD-10-CM

## 2018-10-13 DIAGNOSIS — I10 Essential (primary) hypertension: Secondary | ICD-10-CM

## 2018-10-13 DIAGNOSIS — R748 Abnormal levels of other serum enzymes: Secondary | ICD-10-CM

## 2018-10-13 DIAGNOSIS — E039 Hypothyroidism, unspecified: Secondary | ICD-10-CM

## 2018-10-13 NOTE — Progress Notes (Signed)
Orders placed for f/u labs.  

## 2018-10-16 NOTE — Telephone Encounter (Signed)
The orders are in the computer for fasting labs.  Please schedule fasting lab appt over the next 3-4 weeks.  Also, she has seen Dr Manuella Ghazi previously for her neck.  Given increased pain, I would like for her to f/u with him.  He had wanted to see her back a few months after the initial visit.  If agreeable, let me know and I will arrange.

## 2018-10-17 NOTE — Telephone Encounter (Signed)
Pt is returning call, please call back

## 2018-10-17 NOTE — Telephone Encounter (Signed)
Left message to call back  

## 2018-10-18 NOTE — Telephone Encounter (Signed)
Patient is scheduled for fasting labs and would like to see Dr Manuella Ghazi again. I let her know that we could reach out to them if needed. She is going to call them as well.

## 2018-10-19 NOTE — Telephone Encounter (Signed)
This pt needs a f/u appt with Dr Manuella Ghazi.  She saw him 06/04/18.  Needs f/u appt for persistent neck pain.  Can you arrange appt and let me know if I need to place any order, etc.  Thanks.

## 2018-11-05 ENCOUNTER — Ambulatory Visit
Admission: EM | Admit: 2018-11-05 | Discharge: 2018-11-05 | Disposition: A | Discharge status: Home / Self Care | Payer: Managed Care, Other (non HMO) | Attending: Urgent Care | Admitting: Urgent Care

## 2018-11-05 ENCOUNTER — Other Ambulatory Visit: Payer: Self-pay

## 2018-11-05 DIAGNOSIS — Z20828 Contact with and (suspected) exposure to other viral communicable diseases: Secondary | ICD-10-CM | POA: Diagnosis not present

## 2018-11-05 DIAGNOSIS — Z20822 Contact with and (suspected) exposure to covid-19: Secondary | ICD-10-CM

## 2018-11-05 NOTE — ED Triage Notes (Signed)
Patient states that she was exposed to Covid by her daughter, recently spent the week at the beach with her daughter.

## 2018-11-05 NOTE — Discharge Instructions (Addendum)
It was very nice seeing you today in clinic. Thank you for entrusting me with your care.  ° °You have been tested for SARS-CoV-2 (novel coronavirus) today. Testing results have been taking between 3 and 7 days. Please self quarantine, per [place] DHHS guidelines, until you have received negative test results.  ° °If you develop any symptoms or concerns, make arrangements to follow up with your regular doctor. If your symptoms are severe, please seek follow up care in the ER. Please remember, our [facility] providers are "right here with you" when you need us.  ° °Again, it was my pleasure to take care of you today. Thank you for choosing our clinic. I hope that you start to feel better quickly.  ° °Dequandre Cordova, MSN, APRN, FNP-C, CEN °Advanced Practice Provider °[facility] °

## 2018-11-05 NOTE — ED Provider Notes (Signed)
[place] [place]   Name: Dawn ArisKatharine Ann Minkin DOB: 06/23/1965 MRN: [id number] CSN: [id number] PCP: Dale DurhamScott, Charlene, MD  Arrival date and time:  11/05/18 1515  Chief Complaint:  Covid Exposure   NOTE: Prior to seeing the patient today, I have reviewed the triage nursing documentation and vital signs. Clinical staff has updated patient's PMH/PSHx, current medication list, and drug allergies/intolerances to ensure comprehensive history available to assist in medical decision making.   History:   HPI: Dawn Lynn is a 53 y.o. female who presents today with complaints of recent direct exposure to SARS-CoV-2 (novel coronavirus). Known exposure reported to have occurred over the course of the last week. Patient and her husband have been at the beach with their daughter. On Saturday, the patient's daughter began having a "stuffy nose", which subsequently prompted her to be seen by her PCP. SARS-CoV-2 testing was performed and that patient's daughter received positive resulted back earlier today. Patient presents today with no symptoms; no cough, fevers, or other symptoms commonly associated with SARS-CoV-2. She advises that she feels generally well. Patient presents for testing out of concern for her personal health.   Past Medical History:  Diagnosis Date  . GERD (gastroesophageal reflux disease)   . Hypertension     Past Surgical History:  Procedure Laterality Date  . ABDOMINAL HYSTERECTOMY  2011  . BREAST BIOPSY Left 12/26/2007   Dr Evette CristalSankar- neg  . CHOLECYSTECTOMY  03/2012   Dr Malissa HippoW Smith  . UPPER GI ENDOSCOPY     upper Endoscopy (10-15-2010)     Family History  Problem Relation Age of Onset  . Hypertension Mother   . Breast cancer Mother 3259       negative genetic testing  . Heart block Father   . Heart disease Maternal Grandmother   . Stroke Maternal Grandmother   . Hypertension Maternal Grandmother   . Heart disease Maternal Grandfather   . Stroke Maternal Grandfather   .  Hypertension Maternal Grandfather   . Diabetes Paternal Grandfather   . Breast cancer Paternal Uncle [id number]    Social History   Tobacco Use  . Smoking status: Former Games developermoker  . Smokeless tobacco: Never Used  . Tobacco comment: smoked in college  Substance Use Topics  . Alcohol use: No    Alcohol/week: 0.0 standard drinks  . Drug use: No    Patient Active Problem List   Diagnosis Date Noted  . Essential hypertension 07/09/2016  . Neck pain 03/05/2016  . Abnormal liver enzymes 02/29/2016  . Trapezius strain 01/21/2016  . Family history of early CAD 11/29/2015  . Travel advice encounter 04/25/2015  . Breast cyst 10/17/2014  . Sleep disturbance 09/24/2014  . Head pain 09/24/2014  . Stress 09/24/2014  . Health care maintenance 09/24/2014  . Hypercholesterolemia 09/03/2013  . Hypothyroidism 09/04/2012  . GERD (gastroesophageal reflux disease) 06/26/2012  . Chronic chest pain 06/26/2012    Home Medications:    Current Meds  Medication Sig  . levothyroxine (SYNTHROID) 100 MCG tablet TAKE 1 TABLET (100 MCG TOTAL) BY MOUTH DAILY.  Marland Kitchen. levothyroxine (SYNTHROID, LEVOTHROID) 88 MCG tablet Take 1 tablet (88 mcg total) by mouth daily before breakfast.  . telmisartan (MICARDIS) 20 MG tablet Please specify directions, refills and quantity    Allergies:   Patient has no known allergies.  Review of Systems (ROS): Review of Systems  Constitutional: Negative for fatigue and fever.  HENT: Negative for congestion, ear pain, postnasal drip, rhinorrhea, sinus pressure, sinus pain, sneezing and sore throat.  Eyes: Negative for pain, discharge and redness.  Respiratory: Negative for cough, chest tightness and shortness of breath.   Cardiovascular: Negative for chest pain and palpitations.  Gastrointestinal: Negative for abdominal pain, diarrhea, nausea and vomiting.  Musculoskeletal: Negative for arthralgias, back pain, myalgias and neck pain.  Skin: Negative for color change, pallor and  rash.  Neurological: Negative for dizziness, syncope, weakness and headaches.  Hematological: Negative for adenopathy.     Vital Signs: Today's Vitals   11/05/18 1541 11/05/18 1547  BP: 135/85   Pulse: 79   Resp: 18   Temp: 98.5 F (36.9 C)   TempSrc: Oral   SpO2: 99%   Weight: 137 lb (62.1 kg)   PainSc: 0-No pain 0-No pain    Physical Exam: Physical Exam  Constitutional: She is oriented to person, place, and time and well-developed, well-nourished, and in no distress. No distress.  HENT:  Head: Normocephalic and atraumatic.  Nose: Nose normal.  Mouth/Throat: Oropharynx is clear and moist.  Eyes: Pupils are equal, round, and reactive to light. Conjunctivae and EOM are normal.  Neck: Normal range of motion. Neck supple. No tracheal deviation present.  Cardiovascular: Normal rate, regular rhythm, normal heart sounds and intact distal pulses. Exam reveals no gallop and no friction rub.  No murmur heard. Pulmonary/Chest: Effort normal and breath sounds normal. No respiratory distress. She has no wheezes. She has no rales.  Abdominal: Soft. Bowel sounds are normal. She exhibits no distension. There is no abdominal tenderness.  Musculoskeletal: Normal range of motion.  Lymphadenopathy:    She has no cervical adenopathy.  Neurological: She is alert and oriented to person, place, and time. Gait normal. GCS score is 15.  Skin: Skin is warm and dry. No rash noted. She is not diaphoretic.  Psychiatric: Mood, memory, affect and judgment normal.  Nursing note and vitals reviewed.   Urgent Care Treatments / Results:   LABS: PLEASE NOTE: all labs that were ordered this encounter are listed, however only abnormal results are displayed. Labs Reviewed  NOVEL CORONAVIRUS, NAA (HOSPITAL ORDER, SEND-OUT TO REF LAB)    EKG: -None  RADIOLOGY: No results found.  PROCEDURES: Procedures  MEDICATIONS RECEIVED THIS VISIT: Medications - No data to display  PERTINENT CLINICAL COURSE  NOTES/UPDATES:   Initial Impression / Assessment and Plan / Urgent Care Course:  Pertinent labs & imaging results that were available during my care of the patient were personally reviewed by me and considered in my medical decision making (see lab/imaging section of note for values and interpretations).  Dawn ArisKatharine Ann Hammerschmidt is a 53 y.o. female who [facility] with complaints of Covid Exposure   Patient overall well appearing and in no acute distress today in clinic. Presenting symptoms (see HPI) and exam as documented above. She presents following a direct exposure to SARS-CoV-2 (novel coronavirus). Discussed typical symptom constellation. Reviewed potential for infection with recent close contact. Given exposure and potential for infection, testing is reasonable. Patient collected SARS-CoV-2 via facility approved self swab process today under the supervision of certified clinical staff. Discussed variable turn around times associated with testing, as swabs are being processed at [facility], and have been taking as long as 7 days. She was advised to self quarantine, per [facility] DH[facility] guidelines, until negative results received.   Discussed follow up with primary care physician should she develop any concerning symptoms. I have reviewed the follow up and strict return precautions for any new or worsening symptoms. Patient is aware of  symptoms that would be deemed urgent/emergent, and would thus require further evaluation either here or in the emergency department. At the time of discharge, he verbalized understanding and consent with the discharge plan as it was reviewed with him. All questions were fielded by provider and/or clinic staff prior to patient discharge.     Final Clinical Impressions / Urgent Care Diagnoses:   Final diagnoses:  Exposure to Covid-19 Virus    New Prescriptions:  [place] Controlled Substance Registry consulted? Not Applicable  No orders of the defined  types were placed in this encounter.   Recommended Follow up Care:  Patient encouraged to follow up with the following provider within the specified time frame, or sooner as dictated by the severity of her symptoms. As always, she was instructed that for any urgent/emergent care needs, she should seek care either here or in the emergency department for more immediate evaluation.  Follow-up Information    Einar Pheasant, MD.   Specialty: Internal Medicine Why: As needed Contact information: [address] [place] [id number] [phone]         NOTE: This note was prepared using Dragon dictation software along with smaller phrase technology. Despite my best ability to proofread, there is the potential that transcriptional errors may still occur from this process, and are completely unintentional.    Karen Kitchens, NP 11/05/18 2232

## 2018-11-06 LAB — NOVEL CORONAVIRUS, NAA (HOSP ORDER, SEND-OUT TO REF LAB; TAT 18-24 HRS): SARS-CoV-2, NAA: NOT DETECTED

## 2018-11-15 ENCOUNTER — Other Ambulatory Visit (INDEPENDENT_AMBULATORY_CARE_PROVIDER_SITE_OTHER): Payer: Managed Care, Other (non HMO)

## 2018-11-15 ENCOUNTER — Other Ambulatory Visit: Payer: Self-pay

## 2018-11-15 DIAGNOSIS — E7800 Pure hypercholesterolemia, unspecified: Secondary | ICD-10-CM | POA: Diagnosis not present

## 2018-11-15 DIAGNOSIS — R748 Abnormal levels of other serum enzymes: Secondary | ICD-10-CM | POA: Diagnosis not present

## 2018-11-15 DIAGNOSIS — I10 Essential (primary) hypertension: Secondary | ICD-10-CM

## 2018-11-15 DIAGNOSIS — E039 Hypothyroidism, unspecified: Secondary | ICD-10-CM

## 2018-11-15 LAB — HEPATIC FUNCTION PANEL
ALT: 25 U/L (ref 0–35)
AST: 20 U/L (ref 0–37)
Albumin: 4.6 g/dL (ref 3.5–5.2)
Alkaline Phosphatase: 61 U/L (ref 39–117)
Bilirubin, Direct: 0 mg/dL (ref 0.0–0.3)
Total Bilirubin: 0.4 mg/dL (ref 0.2–1.2)
Total Protein: 7.2 g/dL (ref 6.0–8.3)

## 2018-11-15 LAB — TSH: TSH: 0.27 u[IU]/mL — ABNORMAL LOW (ref 0.35–4.50)

## 2018-11-15 LAB — BASIC METABOLIC PANEL
BUN: 12 mg/dL (ref 6–23)
CO2: 25 mEq/L (ref 19–32)
Calcium: 9.9 mg/dL (ref 8.4–10.5)
Chloride: 107 mEq/L (ref 96–112)
Creatinine, Ser: 0.82 mg/dL (ref 0.40–1.20)
GFR: 73.38 mL/min (ref >60.00)
Glucose, Bld: 96 mg/dL (ref 70–99)
Potassium: 3.8 mEq/L (ref 3.5–5.1)
Sodium: 140 mEq/L (ref 135–145)

## 2018-11-15 LAB — LIPID PANEL
Cholesterol: 285 mg/dL — ABNORMAL HIGH (ref 0–200)
HDL: 41.7 mg/dL (ref >39.00)
NonHDL: 243.25
Total CHOL/HDL Ratio: 7
Triglycerides: 225 mg/dL — ABNORMAL HIGH (ref 0.0–149.0)
VLDL: 45 mg/dL — ABNORMAL HIGH (ref 0.0–40.0)

## 2018-11-15 LAB — LDL CHOLESTEROL, DIRECT: Direct LDL: 166 mg/dL

## 2018-11-19 ENCOUNTER — Other Ambulatory Visit: Payer: Self-pay | Admitting: Internal Medicine

## 2018-11-19 DIAGNOSIS — E039 Hypothyroidism, unspecified: Secondary | ICD-10-CM

## 2018-11-19 NOTE — Progress Notes (Signed)
Order placed for f/u tsh.  

## 2018-11-21 ENCOUNTER — Other Ambulatory Visit: Payer: Self-pay

## 2018-11-21 MED ORDER — LEVOTHYROXINE SODIUM 75 MCG PO TABS: 75.0000 ug | ORAL_TABLET | Freq: Every day | ORAL | 1 refills | Status: DC

## 2018-12-17 ENCOUNTER — Other Ambulatory Visit: Payer: Self-pay | Admitting: Internal Medicine

## 2018-12-17 NOTE — Telephone Encounter (Signed)
Looks like this medication was discontinued by a different provider on 11/05/2018.

## 2018-12-17 NOTE — Telephone Encounter (Signed)
rx ok'd for trazodone #180 with no refills.

## 2018-12-23 ENCOUNTER — Other Ambulatory Visit: Payer: Self-pay | Admitting: Internal Medicine

## 2018-12-27 ENCOUNTER — Ambulatory Visit (INDEPENDENT_AMBULATORY_CARE_PROVIDER_SITE_OTHER): Payer: Managed Care, Other (non HMO) | Admitting: Internal Medicine

## 2018-12-27 ENCOUNTER — Encounter: Payer: Self-pay | Admitting: Internal Medicine

## 2018-12-27 ENCOUNTER — Other Ambulatory Visit: Payer: Self-pay

## 2018-12-27 DIAGNOSIS — E7800 Pure hypercholesterolemia, unspecified: Secondary | ICD-10-CM | POA: Diagnosis not present

## 2018-12-27 DIAGNOSIS — I10 Essential (primary) hypertension: Secondary | ICD-10-CM

## 2018-12-27 DIAGNOSIS — R748 Abnormal levels of other serum enzymes: Secondary | ICD-10-CM | POA: Diagnosis not present

## 2018-12-27 DIAGNOSIS — M542 Cervicalgia: Secondary | ICD-10-CM

## 2018-12-27 DIAGNOSIS — E039 Hypothyroidism, unspecified: Secondary | ICD-10-CM

## 2018-12-27 MED ORDER — CYCLOBENZAPRINE HCL 10 MG PO TABS: 10.0000 mg | ORAL_TABLET | Freq: Three times a day (TID) | ORAL | 0 refills | Status: DC | PRN

## 2018-12-27 NOTE — Progress Notes (Signed)
[name], female   DOB: 09/11/1965, 53 y.o.   MRN: [id number]   Virtual Visit via video Note  This visit type was conducted due to national recommendations for restrictions regarding the COVID-19 pandemic (e.g. social distancing).  This format is felt to be most appropriate for this patient at this time.  All issues noted in this document were discussed and addressed.  No physical exam was performed (except for noted visual exam findings with Video Visits).   I connected with Chaney Born by a video enabled telemedicine application and verified that I am speaking with the correct person using two identifiers. Location patient: home Location provider: work Persons participating in the virtual visit: patient, provider  I discussed the limitations, risks, security and privacy concerns of performing an evaluation and management service by video and the availability of in person appointments. The patient expressed understanding and agreed to proceed.    Reason for visit: scheduled follow up.   HPI: She reports she is doing well.  Feels good.  Handling stress.  Daughter tested positive previously for covid. Doing well.  She never tested positive.  No chest pain.  No cough or congestion.  No acid reflux.  No abdominal pain.  Bowels moving.  Working from home.  Losing weight.  Exercising.  Neck is doing better.  Takes flexeril prn.  Discussed recently labs.  Just adjusted synthroid dose.     ROS: See pertinent positives and negatives per HPI.  Past Medical History:  Diagnosis Date  . GERD (gastroesophageal reflux disease)   . Hypertension     Past Surgical History:  Procedure Laterality Date  . ABDOMINAL HYSTERECTOMY  2011  . BREAST BIOPSY Left 12/26/2007   Dr Jamal Collin- neg  . CHOLECYSTECTOMY  03/2012   Dr Nicholes Stairs  . UPPER GI ENDOSCOPY     upper Endoscopy (10-15-2010)     Family History  Problem Relation Age of Onset  . Hypertension Mother   . Breast cancer  Mother 49       negative genetic testing  . Heart block Father   . Heart disease Maternal Grandmother   . Stroke Maternal Grandmother   . Hypertension Maternal Grandmother   . Heart disease Maternal Grandfather   . Stroke Maternal Grandfather   . Hypertension Maternal Grandfather   . Diabetes Paternal Grandfather   . Breast cancer Paternal Uncle 73    SOCIAL HX: reviewed.    Current Outpatient Medications:  .  levothyroxine (SYNTHROID) 75 MCG tablet, Take 1 tablet (75 mcg total) by mouth daily before breakfast., Disp: 90 tablet, Rfl: 1 .  losartan (COZAAR) 25 MG tablet, TAKE 1 TABLET BY MOUTH EVERY DAY, Disp: 90 tablet, Rfl: 3 .  traZODone (DESYREL) 50 MG tablet, TAKE 2 TABLETS (100 MG TOTAL) BY MOUTH AT BEDTIME., Disp: 180 tablet, Rfl: 0 .  cyclobenzaprine (FLEXERIL) 10 MG tablet, Take 1 tablet (10 mg total) by mouth 3 (three) times daily as needed for muscle spasms., Disp: 30 tablet, Rfl: 0  EXAM:  VITALS per patient if applicable: weight 315VVO  GENERAL: alert, oriented, appears well and in no acute distress  HEENT: atraumatic, conjunttiva clear, no obvious abnormalities on inspection of external nose and ears  NECK: normal movements of the head and neck  LUNGS: on inspection no signs of respiratory distress, breathing rate appears normal, no obvious gross SOB, gasping or wheezing  CV: no obvious cyanosis  PSYCH/NEURO: pleasant and cooperative, no obvious depression or anxiety, speech and thought  processing grossly intact  ASSESSMENT AND PLAN:  Discussed the following assessment and plan:  Abnormal liver enzymes Had elevation after starting cholesterol medication.  Off now.  Follow liver function tests.    Essential hypertension Blood pressure has been under good control.  Continue current medication regimen.  Follow pressures. Follow metabolic panel   Hypercholesterolemia Low cholesterol diet and exercise.  Follow lipid panel.   Hypothyroidism On thyroid  replacement.  Follow tsh.   Neck pain Stable.  Takes flexeril prn.      I discussed the assessment and treatment plan with the patient. The patient was provided an opportunity to ask questions and all were answered. The patient agreed with the plan and demonstrated an understanding of the instructions.   The patient was advised to call back or seek an in-person evaluation if the symptoms worsen or if the condition fails to improve as anticipated.   Dale [place], MD

## 2018-12-29 ENCOUNTER — Encounter: Payer: Self-pay | Admitting: Internal Medicine

## 2018-12-29 NOTE — Assessment & Plan Note (Signed)
Had elevation after starting cholesterol medication.  Off now.  Follow liver function tests.

## 2018-12-29 NOTE — Assessment & Plan Note (Signed)
Blood pressure has been under good control.  Continue current medication regimen.  Follow pressures.  Follow metabolic panel.  

## 2018-12-29 NOTE — Assessment & Plan Note (Signed)
Stable.  Takes flexeril prn.

## 2018-12-29 NOTE — Assessment & Plan Note (Signed)
Low cholesterol diet and exercise.  Follow lipid panel.   

## 2018-12-29 NOTE — Assessment & Plan Note (Signed)
On thyroid replacement.  Follow tsh.  

## 2019-01-16 ENCOUNTER — Other Ambulatory Visit (INDEPENDENT_AMBULATORY_CARE_PROVIDER_SITE_OTHER): Payer: Managed Care, Other (non HMO)

## 2019-01-16 ENCOUNTER — Other Ambulatory Visit: Payer: Self-pay

## 2019-01-16 DIAGNOSIS — E039 Hypothyroidism, unspecified: Secondary | ICD-10-CM | POA: Diagnosis not present

## 2019-01-17 LAB — TSH: TSH: 2.09 u[IU]/mL (ref 0.35–4.50)

## 2019-01-18 ENCOUNTER — Encounter: Payer: Self-pay | Admitting: Internal Medicine

## 2019-02-07 ENCOUNTER — Ambulatory Visit (INDEPENDENT_AMBULATORY_CARE_PROVIDER_SITE_OTHER): Payer: Managed Care, Other (non HMO)

## 2019-02-07 ENCOUNTER — Other Ambulatory Visit: Payer: Self-pay

## 2019-02-07 DIAGNOSIS — Z23 Encounter for immunization: Secondary | ICD-10-CM | POA: Diagnosis not present

## 2019-02-27 ENCOUNTER — Other Ambulatory Visit: Payer: Self-pay | Admitting: Internal Medicine

## 2019-02-28 MED ORDER — CYCLOBENZAPRINE HCL 10 MG PO TABS: 10.0000 mg | ORAL_TABLET | Freq: Every evening | ORAL | 0 refills | Status: DC | PRN

## 2019-02-28 NOTE — Telephone Encounter (Signed)
rx sent in for cyclobenzaprine #30 with no refills.   

## 2019-03-16 ENCOUNTER — Other Ambulatory Visit: Payer: Self-pay | Admitting: Internal Medicine

## 2019-05-09 ENCOUNTER — Encounter: Payer: Managed Care, Other (non HMO) | Admitting: Internal Medicine

## 2019-05-12 ENCOUNTER — Other Ambulatory Visit: Payer: Self-pay | Admitting: Internal Medicine

## 2019-05-13 MED ORDER — CYCLOBENZAPRINE HCL 10 MG PO TABS: 10.0000 mg | ORAL_TABLET | Freq: Every evening | ORAL | 0 refills | Status: DC | PRN

## 2019-05-19 ENCOUNTER — Other Ambulatory Visit: Payer: Self-pay | Admitting: Internal Medicine

## 2019-05-19 ENCOUNTER — Encounter: Payer: Managed Care, Other (non HMO) | Admitting: Internal Medicine

## 2019-06-16 ENCOUNTER — Other Ambulatory Visit: Payer: Self-pay | Admitting: Internal Medicine

## 2019-06-19 ENCOUNTER — Encounter: Payer: Managed Care, Other (non HMO) | Admitting: Internal Medicine

## 2019-07-21 ENCOUNTER — Encounter: Payer: Managed Care, Other (non HMO) | Admitting: Internal Medicine

## 2019-07-29 ENCOUNTER — Other Ambulatory Visit: Payer: Self-pay | Admitting: Internal Medicine

## 2019-07-29 MED ORDER — CYCLOBENZAPRINE HCL 10 MG PO TABS: 10.0000 mg | ORAL_TABLET | Freq: Every evening | ORAL | 0 refills | Status: DC | PRN

## 2019-08-30 ENCOUNTER — Other Ambulatory Visit: Payer: Self-pay | Admitting: Internal Medicine

## 2019-09-26 ENCOUNTER — Ambulatory Visit (INDEPENDENT_AMBULATORY_CARE_PROVIDER_SITE_OTHER): Payer: Managed Care, Other (non HMO) | Admitting: Internal Medicine

## 2019-09-26 ENCOUNTER — Other Ambulatory Visit: Payer: Self-pay

## 2019-09-26 VITALS — BP 124/76 | HR 71 | Temp 98.0°F | Resp 16 | Ht 62.0 in | Wt 137.2 lb

## 2019-09-26 DIAGNOSIS — F439 Reaction to severe stress, unspecified: Secondary | ICD-10-CM

## 2019-09-26 DIAGNOSIS — E039 Hypothyroidism, unspecified: Secondary | ICD-10-CM

## 2019-09-26 DIAGNOSIS — I10 Essential (primary) hypertension: Secondary | ICD-10-CM

## 2019-09-26 DIAGNOSIS — Z0000 Encounter for general adult medical examination without abnormal findings: Secondary | ICD-10-CM | POA: Diagnosis not present

## 2019-09-26 DIAGNOSIS — Z1231 Encounter for screening mammogram for malignant neoplasm of breast: Secondary | ICD-10-CM | POA: Diagnosis not present

## 2019-09-26 DIAGNOSIS — R748 Abnormal levels of other serum enzymes: Secondary | ICD-10-CM

## 2019-09-26 DIAGNOSIS — E7800 Pure hypercholesterolemia, unspecified: Secondary | ICD-10-CM

## 2019-09-26 DIAGNOSIS — Z1211 Encounter for screening for malignant neoplasm of colon: Secondary | ICD-10-CM

## 2019-09-26 MED ORDER — LEVOTHYROXINE SODIUM 75 MCG PO TABS: 75.0000 ug | ORAL_TABLET | Freq: Every day | ORAL | 1 refills | Status: DC

## 2019-09-26 NOTE — Progress Notes (Signed)
[name], female   DOB: 08-07-1965, 54 y.o.   MRN: [id number]   Subjective:    [name], female    DOB: August 26, 1965, 54 y.o.   MRN: [id number]  HPI This visit occurred during the SARS-CoV-2 public health emergency.  Safety protocols were in place, including screening questions prior to the visit, additional usage of staff PPE, and extensive cleaning of exam room while observing appropriate contact time as indicated for disinfecting solutions.  Patient here for her physical exam.  She reports she is doing well.  Feels good.  Trying to stay active.  Working from home.  No chest pain or sob.  No acid reflux.  Bowels moving.  Still with neck issues intermittently.  Takes muscle relaxer prn.  Controlled now.  Handling stress.  Discussed colonoscopy.     Past Medical History:  Diagnosis Date  . GERD (gastroesophageal reflux disease)   . Hypertension    Past Surgical History:  Procedure Laterality Date  . ABDOMINAL HYSTERECTOMY  2011  . BREAST BIOPSY Left 12/26/2007   Dr Evette Cristal- neg  . CHOLECYSTECTOMY  03/2012   Dr Malissa Hippo  . UPPER GI ENDOSCOPY     upper Endoscopy (10-15-2010)    Family History  Problem Relation Age of Onset  . Hypertension Mother   . Breast cancer Mother 40       negative genetic testing  . Heart block Father   . Heart disease Maternal Grandmother   . Stroke Maternal Grandmother   . Hypertension Maternal Grandmother   . Heart disease Maternal Grandfather   . Stroke Maternal Grandfather   . Hypertension Maternal Grandfather   . Diabetes Paternal Grandfather   . Breast cancer Paternal Uncle 85   Social History   Socioeconomic History  . Marital status: Married    Spouse name: Dawn Lynn  . Number of children: 2  . Years of education: Not on file  . Highest education level: Not on file  Occupational History    Employer: [employer]  Tobacco Use  . Smoking status: Former Games developer  . Smokeless tobacco: Never Used  .  Tobacco comment: smoked in college  Vaping Use  . Vaping Use: Never used  Substance and Sexual Activity  . Alcohol use: No    Alcohol/week: 0.0 standard drinks  . Drug use: No  . Sexual activity: Not on file  Other Topics Concern  . Not on file  Social History Narrative  . Not on file   Social Determinants of Health   Financial Resource Strain:   . Difficulty of Paying Living Expenses:   Food Insecurity:   . Worried About Programme researcher, broadcasting/film/video in the Last Year:   . Barista in the Last Year:   Transportation Needs:   . Freight forwarder (Medical):   Marland Kitchen Lack of Transportation (Non-Medical):   Physical Activity:   . Days of Exercise per Week:   . Minutes of Exercise per Session:   Stress:   . Feeling of Stress :   Social Connections:   . Frequency of Communication with Friends and Family:   . Frequency of Social Gatherings with Friends and Family:   . Attends Religious Services:   . Active Member of Clubs or Organizations:   . Attends Banker Meetings:   Marland Kitchen Marital Status:     Outpatient Encounter Medications as of 09/26/2019  Medication Sig  . cyclobenzaprine (FLEXERIL) 10 MG tablet TAKE 1 TABLET  BY MOUTH AT BEDTIME AS NEEDED FOR MUSCLE SPASMS  . levothyroxine (SYNTHROID) 75 MCG tablet Take 1 tablet (75 mcg total) by mouth daily before breakfast.  . losartan (COZAAR) 25 MG tablet TAKE 1 TABLET BY MOUTH EVERY DAY  . [DISCONTINUED] levothyroxine (SYNTHROID) 75 MCG tablet TAKE 1 TABLET (75 MCG TOTAL) BY MOUTH DAILY BEFORE BREAKFAST.  . [DISCONTINUED] traZODone (DESYREL) 50 MG tablet TAKE 2 TABLETS (100 MG TOTAL) BY MOUTH AT BEDTIME.   No facility-administered encounter medications on file as of 09/26/2019.    Review of Systems  Constitutional: Negative for appetite change and unexpected weight change.  HENT: Negative for congestion and sinus pressure.   Eyes: Negative for pain and visual disturbance.  Respiratory: Negative for cough, chest tightness  and shortness of breath.   Cardiovascular: Negative for chest pain, palpitations and leg swelling.  Gastrointestinal: Negative for abdominal pain, diarrhea, nausea and vomiting.  Genitourinary: Negative for difficulty urinating and dysuria.  Musculoskeletal: Positive for neck pain. Negative for joint swelling and myalgias.  Skin: Negative for color change and rash.  Neurological: Negative for dizziness, light-headedness and headaches.  Hematological: Negative for adenopathy. Does not bruise/bleed easily.  Psychiatric/Behavioral: Negative for agitation and dysphoric mood.       Objective:    Physical Exam Vitals reviewed.  Constitutional:      General: She is not in acute distress.    Appearance: Normal appearance. She is well-developed.  HENT:     Head: Normocephalic and atraumatic.     Right Ear: External ear normal.     Left Ear: External ear normal.  Eyes:     General: No scleral icterus.       Right eye: No discharge.        Left eye: No discharge.     Conjunctiva/sclera: Conjunctivae normal.  Neck:     Thyroid: No thyromegaly.  Cardiovascular:     Rate and Rhythm: Normal rate and regular rhythm.  Pulmonary:     Effort: No tachypnea, accessory muscle usage or respiratory distress.     Breath sounds: Normal breath sounds. No decreased breath sounds or wheezing.  Chest:     Breasts:        Right: No inverted nipple, mass, nipple discharge or tenderness (no axillary adenopathy).        Left: No inverted nipple, mass, nipple discharge or tenderness (no axilarry adenopathy).  Abdominal:     General: Bowel sounds are normal.     Palpations: Abdomen is soft.     Tenderness: There is no abdominal tenderness.  Musculoskeletal:        General: No swelling or tenderness.     Cervical back: Neck supple. No tenderness.  Lymphadenopathy:     Cervical: No cervical adenopathy.  Skin:    Findings: No erythema or rash.  Neurological:     Mental Status: She is alert and oriented  to person, place, and time.  Psychiatric:        Mood and Affect: Mood normal.        Behavior: Behavior normal.     BP 124/76   Pulse 71   Temp 98 F (36.7 C)   Resp 16   Ht 5\' 2"  (1.575 m)   Wt 137 lb 3.2 oz (62.2 kg)   LMP 06/26/2009   SpO2 98%   BMI 25.09 kg/m  Wt Readings from Last 3 Encounters:  09/26/19 137 lb 3.2 oz (62.2 kg)  12/27/18 139 lb (63 kg)  11/05/18 137 lb (  62.1 kg)     Lab Results  Component Value Date   WBC 7.7 05/02/2018   HGB 13.3 05/02/2018   HCT 39.1 05/02/2018   PLT 341.0 05/02/2018   GLUCOSE 96 11/15/2018   CHOL 285 (H) 11/15/2018   TRIG 225.0 (H) 11/15/2018   HDL 41.70 11/15/2018   LDLDIRECT 166.0 11/15/2018   LDLCALC 171 (H) 09/29/2014   ALT 25 11/15/2018   AST 20 11/15/2018   NA 140 11/15/2018   K 3.8 11/15/2018   CL 107 11/15/2018   CREATININE 0.82 11/15/2018   BUN 12 11/15/2018   CO2 25 11/15/2018   TSH 2.09 01/16/2019       Assessment & Plan:   Problem List Items Addressed This Visit    Abnormal liver enzymes    Had elevation after starting cholesterol medication.  Off now.  Low cholesterol diet and exercise.  Follow lipid panel.       Essential hypertension    Blood pressure on recheck 124/78.  Continue losartan.  Follow pressures.  Follow metabolic panel.       Relevant Orders   CBC with Differential/Platelet   Basic metabolic panel   Health care maintenance    Physical today 09/26/19.  PAP 9/17/1 - negative with negative HPV.  Mammogram 05/22/18 - Birads I.  Mammogram ordered.  Discussed need for colonoscopy.  Agreeable for referral.        Hypercholesterolemia    Low cholesterol diet and exercise.  Follow lipid panel.        Relevant Orders   Hepatic function panel   Lipid panel   Hypothyroidism    On thyroid replacement.  Follow tsh.        Relevant Medications   levothyroxine (SYNTHROID) 75 MCG tablet   Other Relevant Orders   TSH   Stress    Overall handling things well.  Follow.         Other  Visit Diagnoses    Encounter for screening mammogram for malignant neoplasm of breast    -  Primary   Relevant Orders   MM 3D SCREEN BREAST BILATERAL   Colon cancer screening       Relevant Orders   Ambulatory referral to Gastroenterology       Dale [place], MD

## 2019-09-26 NOTE — Assessment & Plan Note (Addendum)
Physical today 09/26/19.  PAP 9/17/1 - negative with negative HPV.  Mammogram 05/22/18 - Birads I.  Mammogram ordered.  Discussed need for colonoscopy.  Agreeable for referral.

## 2019-09-28 ENCOUNTER — Encounter: Payer: Self-pay | Admitting: Internal Medicine

## 2019-09-28 NOTE — Assessment & Plan Note (Signed)
On thyroid replacement.  Follow tsh.  

## 2019-09-28 NOTE — Assessment & Plan Note (Signed)
Low cholesterol diet and exercise.  Follow lipid panel.   

## 2019-09-28 NOTE — Assessment & Plan Note (Signed)
Had elevation after starting cholesterol medication.  Off now.  Low cholesterol diet and exercise.  Follow lipid panel.

## 2019-09-28 NOTE — Assessment & Plan Note (Signed)
Blood pressure on recheck 124/78.  Continue losartan.  Follow pressures.  Follow metabolic panel.

## 2019-09-28 NOTE — Assessment & Plan Note (Signed)
Overall handling things well.  Follow.   

## 2019-10-10 ENCOUNTER — Other Ambulatory Visit: Payer: Self-pay

## 2019-10-10 ENCOUNTER — Other Ambulatory Visit (INDEPENDENT_AMBULATORY_CARE_PROVIDER_SITE_OTHER): Payer: Managed Care, Other (non HMO)

## 2019-10-10 DIAGNOSIS — E039 Hypothyroidism, unspecified: Secondary | ICD-10-CM | POA: Diagnosis not present

## 2019-10-10 DIAGNOSIS — E7800 Pure hypercholesterolemia, unspecified: Secondary | ICD-10-CM

## 2019-10-10 DIAGNOSIS — I10 Essential (primary) hypertension: Secondary | ICD-10-CM | POA: Diagnosis not present

## 2019-10-10 LAB — BASIC METABOLIC PANEL WITH GFR
BUN: 13 mg/dL (ref 6–23)
CO2: 27 meq/L (ref 19–32)
Calcium: 10.4 mg/dL (ref 8.4–10.5)
Chloride: 104 meq/L (ref 96–112)
Creatinine, Ser: 0.89 mg/dL (ref 0.40–1.20)
GFR: 66.52 mL/min
Glucose, Bld: 86 mg/dL (ref 70–99)
Potassium: 4.1 meq/L (ref 3.5–5.1)
Sodium: 140 meq/L (ref 135–145)

## 2019-10-10 LAB — CBC WITH DIFFERENTIAL/PLATELET
Basophils Absolute: 0.1 10*3/uL (ref 0.0–0.1)
Basophils Relative: 1.1 % (ref 0.0–3.0)
Eosinophils Absolute: 0.1 10*3/uL (ref 0.0–0.7)
Eosinophils Relative: 2.2 % (ref 0.0–5.0)
HCT: 37.2 % (ref 36.0–46.0)
Hemoglobin: 12.8 g/dL (ref 12.0–15.0)
Lymphocytes Relative: 38.4 % (ref 12.0–46.0)
Lymphs Abs: 2.5 10*3/uL (ref 0.7–4.0)
MCHC: 34.3 g/dL (ref 30.0–36.0)
MCV: 90.8 fl (ref 78.0–100.0)
Monocytes Absolute: 0.5 10*3/uL (ref 0.1–1.0)
Monocytes Relative: 6.8 % (ref 3.0–12.0)
Neutro Abs: 3.4 10*3/uL (ref 1.4–7.7)
Neutrophils Relative %: 51.5 % (ref 43.0–77.0)
Platelets: 346 10*3/uL (ref 150.0–400.0)
RBC: 4.09 Mil/uL (ref 3.87–5.11)
RDW: 13.7 % (ref 11.5–15.5)
WBC: 6.6 10*3/uL (ref 4.0–10.5)

## 2019-10-10 LAB — TSH: TSH: 0.77 u[IU]/mL (ref 0.35–4.50)

## 2019-10-10 LAB — LIPID PANEL
Cholesterol: 268 mg/dL — ABNORMAL HIGH (ref 0–200)
HDL: 46 mg/dL (ref >39.00)
LDL Cholesterol: 187 mg/dL — ABNORMAL HIGH (ref 0–99)
NonHDL: 222.11
Total CHOL/HDL Ratio: 6
Triglycerides: 176 mg/dL — ABNORMAL HIGH (ref 0.0–149.0)
VLDL: 35.2 mg/dL (ref 0.0–40.0)

## 2019-10-10 LAB — HEPATIC FUNCTION PANEL
ALT: 19 U/L (ref 0–35)
AST: 17 U/L (ref 0–37)
Albumin: 4.6 g/dL (ref 3.5–5.2)
Alkaline Phosphatase: 62 U/L (ref 39–117)
Bilirubin, Direct: 0 mg/dL (ref 0.0–0.3)
Total Bilirubin: 0.4 mg/dL (ref 0.2–1.2)
Total Protein: 6.9 g/dL (ref 6.0–8.3)

## 2019-10-13 ENCOUNTER — Encounter: Payer: Self-pay | Admitting: Internal Medicine

## 2019-10-13 NOTE — Telephone Encounter (Signed)
The 10-year ASCVD risk score Denman George DC Montez Hageman., et al., 2013) is: 3.3%   Values used to calculate the score:     Age: 54 years     Sex: Female     Is Non-Hispanic African American: No     Diabetic: No     Tobacco smoker: No     Systolic Blood Pressure: 124 mmHg     Is BP treated: Yes     HDL Cholesterol: 46 mg/dL     Total Cholesterol: 268 mg/dL

## 2019-11-03 ENCOUNTER — Telehealth: Payer: Self-pay | Admitting: Internal Medicine

## 2019-11-03 NOTE — Telephone Encounter (Signed)
Rejection Reason - Patient did not respond" [facility] said 6 days ago  And letter was mailed out.

## 2019-11-19 ENCOUNTER — Other Ambulatory Visit: Payer: Self-pay | Admitting: Internal Medicine

## 2019-11-20 MED ORDER — CYCLOBENZAPRINE HCL 10 MG PO TABS: 10.0000 mg | ORAL_TABLET | Freq: Every day | ORAL | 0 refills | Status: DC

## 2019-12-16 ENCOUNTER — Encounter: Payer: Self-pay | Admitting: Internal Medicine

## 2019-12-18 ENCOUNTER — Other Ambulatory Visit: Payer: Self-pay | Admitting: Internal Medicine

## 2020-01-29 ENCOUNTER — Other Ambulatory Visit: Payer: Self-pay | Admitting: Internal Medicine

## 2020-01-30 MED ORDER — CYCLOBENZAPRINE HCL 10 MG PO TABS: 10.0000 mg | ORAL_TABLET | Freq: Every day | ORAL | 0 refills | Status: DC

## 2020-03-31 ENCOUNTER — Ambulatory Visit: Payer: Managed Care, Other (non HMO) | Admitting: Internal Medicine

## 2020-04-05 ENCOUNTER — Other Ambulatory Visit: Payer: Self-pay | Admitting: Internal Medicine

## 2020-04-05 MED ORDER — CYCLOBENZAPRINE HCL 10 MG PO TABS: 10.0000 mg | ORAL_TABLET | Freq: Every day | ORAL | 0 refills | Status: DC

## 2020-04-14 ENCOUNTER — Telehealth: Payer: Managed Care, Other (non HMO) | Admitting: Internal Medicine

## 2020-05-03 ENCOUNTER — Encounter: Payer: Self-pay | Admitting: Internal Medicine

## 2020-05-03 ENCOUNTER — Telehealth (INDEPENDENT_AMBULATORY_CARE_PROVIDER_SITE_OTHER): Payer: Managed Care, Other (non HMO) | Admitting: Internal Medicine

## 2020-05-03 ENCOUNTER — Telehealth: Payer: Self-pay | Admitting: Internal Medicine

## 2020-05-03 VITALS — Ht 62.0 in | Wt 137.0 lb

## 2020-05-03 DIAGNOSIS — I10 Essential (primary) hypertension: Secondary | ICD-10-CM

## 2020-05-03 DIAGNOSIS — E039 Hypothyroidism, unspecified: Secondary | ICD-10-CM | POA: Diagnosis not present

## 2020-05-03 DIAGNOSIS — F439 Reaction to severe stress, unspecified: Secondary | ICD-10-CM | POA: Diagnosis not present

## 2020-05-03 DIAGNOSIS — E7800 Pure hypercholesterolemia, unspecified: Secondary | ICD-10-CM | POA: Diagnosis not present

## 2020-05-03 DIAGNOSIS — Z1159 Encounter for screening for other viral diseases: Secondary | ICD-10-CM

## 2020-05-03 DIAGNOSIS — Z114 Encounter for screening for human immunodeficiency virus [HIV]: Secondary | ICD-10-CM

## 2020-05-03 DIAGNOSIS — Z1211 Encounter for screening for malignant neoplasm of colon: Secondary | ICD-10-CM

## 2020-05-03 DIAGNOSIS — R748 Abnormal levels of other serum enzymes: Secondary | ICD-10-CM

## 2020-05-03 NOTE — Assessment & Plan Note (Signed)
She is walking regularly.  Watching her diet.  Schedule her for lipid panel.

## 2020-05-03 NOTE — Assessment & Plan Note (Signed)
On thyroid replacement.  Follow tsh.  

## 2020-05-03 NOTE — Assessment & Plan Note (Signed)
Had elevation after starting cholesterol medication.  Low cholesterol diet.  Is walking.  Recheck liver panel with next labs.

## 2020-05-03 NOTE — Telephone Encounter (Signed)
LMTCB to schedule a CPE in 22m and fasting labs in 1-2 weeks

## 2020-05-03 NOTE — Addendum Note (Signed)
Addended by: Charm Barges on: 05/03/2020 08:10 AM   Modules accepted: Orders

## 2020-05-03 NOTE — Assessment & Plan Note (Signed)
Has been on losartan.  Follow pressures.  Schedule metabolic panel.

## 2020-05-03 NOTE — Assessment & Plan Note (Signed)
Overall appears to be handling things well.  Follow.  ?

## 2020-05-03 NOTE — Assessment & Plan Note (Signed)
Discussed with her today.  Needs colonoscopy.  Agreeable to referral.

## 2020-05-03 NOTE — Progress Notes (Addendum)
[name], female   DOB: 04/28/1965, 55 y.o.   MRN: [id number]   Virtual Visit via video Note  This visit type was conducted due to national recommendations for restrictions regarding the COVID-19 pandemic (e.g. social distancing).  This format is felt to be most appropriate for this patient at this time.  All issues noted in this document were discussed and addressed.  No physical exam was performed (except for noted visual exam findings with Video Visits).   I connected with Dawn Lynn by a video enabled telemedicine application and verified that I am speaking with the correct person using two identifiers. Location patient: home Location provider: work Persons participating in the virtual visit: patient, provider  The limitations, risks, security and privacy concerns of performing an evaluation and management service by video and the availability of in person appointments have been discussed. It has also been discussed with the patient that there may be a patient responsible charge related to this service. The patient expressed understanding and agreed to proceed.   Reason for visit: scheduled follow up.   HPI: Follow up regarding her cholesterol.  She is still working from home.  This is going relatively well.  Handling stress.  Walking daily - 2 miles per day.  No chest pain or sob with increased activity or exertion.  No acid reflux or abdominal pain reported.  No bowel problems reported.  Discussed need for colonoscopy.  Discussed labs.  Had questions about covid booster.    ROS: See pertinent positives and negatives per HPI.  Past Medical History:  Diagnosis Date  . GERD (gastroesophageal reflux disease)   . Hypertension     Past Surgical History:  Procedure Laterality Date  . ABDOMINAL HYSTERECTOMY  2011  . BREAST BIOPSY Left 12/26/2007   Dr Jamal Collin- neg  . CHOLECYSTECTOMY  03/2012   Dr Nicholes Stairs  . UPPER GI ENDOSCOPY     upper Endoscopy (10-15-2010)      Family History  Problem Relation Age of Onset  . Hypertension Mother   . Breast cancer Mother 22       negative genetic testing  . Heart block Father   . Heart disease Maternal Grandmother   . Stroke Maternal Grandmother   . Hypertension Maternal Grandmother   . Heart disease Maternal Grandfather   . Stroke Maternal Grandfather   . Hypertension Maternal Grandfather   . Diabetes Paternal Grandfather   . Breast cancer Paternal Uncle 34    SOCIAL HX: reviewed.    Current Outpatient Medications:  .  cyclobenzaprine (FLEXERIL) 10 MG tablet, Take 1 tablet (10 mg total) by mouth at bedtime., Disp: 30 tablet, Rfl: 0 .  levothyroxine (SYNTHROID) 75 MCG tablet, Take 1 tablet (75 mcg total) by mouth daily before breakfast., Disp: 90 tablet, Rfl: 1 .  losartan (COZAAR) 25 MG tablet, TAKE 1 TABLET BY MOUTH EVERY DAY, Disp: 90 tablet, Rfl: 3  EXAM:  GENERAL: alert, oriented, appears well and in no acute distress  HEENT: atraumatic, conjunttiva clear, no obvious abnormalities on inspection of external nose and ears  NECK: normal movements of the head and neck  LUNGS: on inspection no signs of respiratory distress, breathing rate appears normal, no obvious gross SOB, gasping or wheezing  CV: no obvious cyanosis  PSYCH/NEURO: pleasant and cooperative, no obvious depression or anxiety, speech and thought processing grossly intact  ASSESSMENT AND PLAN:  Discussed the following assessment and plan:  Problem List Items Addressed This Visit  Abnormal liver enzymes    Had elevation after starting cholesterol medication.  Low cholesterol diet.  Is walking.  Recheck liver panel with next labs.        Colon cancer screening    Discussed with her today.  Needs colonoscopy.  Agreeable to referral.        Relevant Orders   Ambulatory referral to Gastroenterology   Essential hypertension    Has been on losartan.  Follow pressures.  Schedule metabolic panel.       Relevant Orders    Basic metabolic panel   Hypercholesterolemia    She is walking regularly.  Watching her diet.  Schedule her for lipid panel.        Relevant Orders   Hepatic function panel   Lipid panel   Hypothyroidism    On thyroid replacement.  Follow tsh.       Relevant Orders   TSH   Stress    Overall appears to be handling things well. Follow.        Other Visit Diagnoses    Screening for HIV (human immunodeficiency virus)    -  Primary   Relevant Orders   HIV antibody (with reflex)   Need for hepatitis C screening test       Relevant Orders   Hepatitis C antibody       I discussed the assessment and treatment plan with the patient. The patient was provided an opportunity to ask questions and all were answered. The patient agreed with the plan and demonstrated an understanding of the instructions.   The patient was advised to call back or seek an in-person evaluation if the symptoms worsen or if the condition fails to improve as anticipated.   Einar Pheasant, MD

## 2020-05-06 ENCOUNTER — Other Ambulatory Visit: Payer: Self-pay

## 2020-05-06 ENCOUNTER — Telehealth (INDEPENDENT_AMBULATORY_CARE_PROVIDER_SITE_OTHER): Payer: Self-pay | Admitting: Gastroenterology

## 2020-05-06 DIAGNOSIS — Z1211 Encounter for screening for malignant neoplasm of colon: Secondary | ICD-10-CM

## 2020-05-06 MED ORDER — NA SULFATE-K SULFATE-MG SULF 17.5-3.13-1.6 GM/177ML PO SOLN: 1.0000 | Freq: Once | ORAL | 0 refills | Status: AC

## 2020-05-06 NOTE — Progress Notes (Signed)
Gastroenterology Pre-Procedure Review  Request Date: Friday 07/23/20 Requesting Physician: Dr. Servando Snare  PATIENT REVIEW QUESTIONS: The patient responded to the following health history questions as indicated:    1. Are you having any GI issues? no 2. Do you have a personal history of Polyps? no 3. Do you have a family history of Colon Cancer or Polyps? yes (grandmother colon polyps) 4. Diabetes Mellitus? no 5. Joint replacements in the past 12 months?no 6. Major health problems in the past 3 months?no 7. Any artificial heart valves, MVP, or defibrillator?no    MEDICATIONS & ALLERGIES:    Patient reports the following regarding taking any anticoagulation/antiplatelet therapy:   Plavix, Coumadin, Eliquis, Xarelto, Lovenox, Pradaxa, Brilinta, or Effient? no Aspirin? no  Patient confirms/reports the following medications:  Current Outpatient Medications  Medication Sig Dispense Refill  . cyclobenzaprine (FLEXERIL) 10 MG tablet Take 1 tablet (10 mg total) by mouth at bedtime. 30 tablet 0  . diphenhydrAMINE HCl (BENADRYL ALLERGY PO) Take by mouth at bedtime as needed.    Marland Kitchen levothyroxine (SYNTHROID) 75 MCG tablet Take 1 tablet (75 mcg total) by mouth daily before breakfast. 90 tablet 1  . losartan (COZAAR) 25 MG tablet TAKE 1 TABLET BY MOUTH EVERY DAY 90 tablet 3   No current facility-administered medications for this visit.    Patient confirms/reports the following allergies:  No Known Allergies  No orders of the defined types were placed in this encounter.   AUTHORIZATION INFORMATION Primary Insurance: 1D#: Group #:  Secondary Insurance: 1D#: Group #:  SCHEDULE INFORMATION: Date: 07/23/20 Time: Location:[facility]

## 2020-05-25 ENCOUNTER — Other Ambulatory Visit: Payer: Self-pay

## 2020-05-25 ENCOUNTER — Other Ambulatory Visit (INDEPENDENT_AMBULATORY_CARE_PROVIDER_SITE_OTHER): Payer: Managed Care, Other (non HMO)

## 2020-05-25 DIAGNOSIS — Z114 Encounter for screening for human immunodeficiency virus [HIV]: Secondary | ICD-10-CM | POA: Diagnosis not present

## 2020-05-25 DIAGNOSIS — Z1159 Encounter for screening for other viral diseases: Secondary | ICD-10-CM

## 2020-05-25 DIAGNOSIS — E039 Hypothyroidism, unspecified: Secondary | ICD-10-CM | POA: Diagnosis not present

## 2020-05-25 DIAGNOSIS — I10 Essential (primary) hypertension: Secondary | ICD-10-CM

## 2020-05-25 DIAGNOSIS — E7800 Pure hypercholesterolemia, unspecified: Secondary | ICD-10-CM | POA: Diagnosis not present

## 2020-05-25 LAB — BASIC METABOLIC PANEL
BUN: 12 mg/dL (ref 6–23)
CO2: 26 mEq/L (ref 19–32)
Calcium: 10.3 mg/dL (ref 8.4–10.5)
Chloride: 104 mEq/L (ref 96–112)
Creatinine, Ser: 0.87 mg/dL (ref 0.40–1.20)
GFR: 76.33 mL/min (ref >60.00)
Glucose, Bld: 92 mg/dL (ref 70–99)
Potassium: 4.3 mEq/L (ref 3.5–5.1)
Sodium: 140 mEq/L (ref 135–145)

## 2020-05-25 LAB — TSH: TSH: 4.06 u[IU]/mL (ref 0.35–4.50)

## 2020-05-25 LAB — LIPID PANEL
Cholesterol: 294 mg/dL — ABNORMAL HIGH (ref 0–200)
HDL: 42.4 mg/dL (ref >39.00)
Total CHOL/HDL Ratio: 7
Triglycerides: 441 mg/dL — ABNORMAL HIGH (ref 0.0–149.0)

## 2020-05-25 LAB — HEPATIC FUNCTION PANEL
ALT: 27 U/L (ref 0–35)
AST: 19 U/L (ref 0–37)
Albumin: 4.4 g/dL (ref 3.5–5.2)
Alkaline Phosphatase: 78 U/L (ref 39–117)
Bilirubin, Direct: 0 mg/dL (ref 0.0–0.3)
Total Bilirubin: 0.2 mg/dL (ref 0.2–1.2)
Total Protein: 7.1 g/dL (ref 6.0–8.3)

## 2020-05-25 LAB — LDL CHOLESTEROL, DIRECT: Direct LDL: 141 mg/dL

## 2020-05-26 ENCOUNTER — Encounter: Payer: Self-pay | Admitting: Internal Medicine

## 2020-05-27 LAB — HIV ANTIBODY (ROUTINE TESTING W REFLEX): HIV 1&2 Ab, 4th Generation: NONREACTIVE

## 2020-05-27 LAB — HEPATITIS C ANTIBODY
Hepatitis C Ab: NONREACTIVE
SIGNAL TO CUT-OFF: 0.01 (ref <1.00)

## 2020-06-06 ENCOUNTER — Encounter: Payer: Self-pay | Admitting: Internal Medicine

## 2020-07-09 ENCOUNTER — Other Ambulatory Visit: Payer: Self-pay

## 2020-07-09 ENCOUNTER — Other Ambulatory Visit: Payer: Self-pay | Admitting: Internal Medicine

## 2020-07-09 ENCOUNTER — Encounter: Payer: Self-pay | Admitting: Cardiovascular Disease

## 2020-07-09 ENCOUNTER — Ambulatory Visit (INDEPENDENT_AMBULATORY_CARE_PROVIDER_SITE_OTHER): Payer: Managed Care, Other (non HMO) | Admitting: Cardiovascular Disease

## 2020-07-09 VITALS — BP 132/80 | HR 71 | Ht 61.0 in | Wt 143.5 lb

## 2020-07-09 DIAGNOSIS — K219 Gastro-esophageal reflux disease without esophagitis: Secondary | ICD-10-CM

## 2020-07-09 DIAGNOSIS — E7800 Pure hypercholesterolemia, unspecified: Secondary | ICD-10-CM | POA: Diagnosis not present

## 2020-07-09 DIAGNOSIS — I10 Essential (primary) hypertension: Secondary | ICD-10-CM

## 2020-07-09 DIAGNOSIS — Z8249 Family history of ischemic heart disease and other diseases of the circulatory system: Secondary | ICD-10-CM

## 2020-07-09 DIAGNOSIS — E785 Hyperlipidemia, unspecified: Secondary | ICD-10-CM

## 2020-07-09 NOTE — Patient Instructions (Addendum)
Medication Instructions:  No change If you need a refill on your cardiac medications before your next appointment, please call your pharmacy.    Lab work: No new labs needed  Testing/Procedures: CT coronary calcium score  (hyperlipidemia, family hx)  $99 out of pocket expense  at our [facility] in [place]  This procedure uses special x-ray equipment to produce pictures of the coronary arteries to determine if they are blocked or narrowed by the buildup of plaque - an indicator for atherosclerosis or coronary artery disease (CAD).  Please call [phone] to schedule at your earliest convince   [facility] [address] [place], [place] [address].   Follow-Up:  . You will need a follow up appointment as needed  Call the office when you would like to be seen  . Providers on your designated Care Team:   . Nicolasa Ducking, NP . Eula Listen, PA-C . Marisue Ivan, PA-C  COVID-19 Vaccine Information can be found at: [email or web address] For questions related to vaccine distribution or appointments, please email [email or web address] or call [phone].

## 2020-07-09 NOTE — Progress Notes (Signed)
Cardiology Office Note  Date:  07/09/2020   ID:  Dawn Lynn, DOB 15-Jul-1965, MRN [id number]  PCP:  Dale [place], MD   Chief Complaint  Patient presents with  . New Patient (Initial Visit)    Self referral for a family hx of CAD & hyperlipidemia. She would like to discuss her cholesterol readings from Dale Loretto, M.D. Medications reviewed by the patient verbally.     HPI:  Dawn Lynn is a 55 year old woman with past medical history of Hyperlipidemia Thyroid issue Family history of cardiac disease Who presents for hyperlipidemia, family history of cardiac disease  At baseline reports that she feels well, Denies anginal symptoms, active at baseline,  Recent lab work discussed LDL 141 Total cholesterol 132 Triglycerides 441 HDL 42  BP at home 120/70 Has been on losartan for many years, Diagnosed with high blood pressure when she was pregnant, never stopped the medication  EKG personally reviewed by myself on todays visit Normal sinus rhythm with rate 69 bpm no significant ST or T wave changes   PMH:   has a past medical history of GERD (gastroesophageal reflux disease) and Hypertension.  PSH:    Past Surgical History:  Procedure Laterality Date  . ABDOMINAL HYSTERECTOMY  2011  . BREAST BIOPSY Left 12/26/2007   Dr Evette Cristal- neg  . CHOLECYSTECTOMY  03/2012   Dr Malissa Hippo  . UPPER GI ENDOSCOPY     upper Endoscopy (10-15-2010)     Current Outpatient Medications  Medication Sig Dispense Refill  . cyclobenzaprine (FLEXERIL) 10 MG tablet Take 1 tablet (10 mg total) by mouth at bedtime. 30 tablet 0  . diphenhydrAMINE HCl (BENADRYL ALLERGY PO) Take by mouth at bedtime as needed.    Marland Kitchen levothyroxine (SYNTHROID) 75 MCG tablet Take 1 tablet (75 mcg total) by mouth daily before breakfast. 90 tablet 1  . losartan (COZAAR) 25 MG tablet TAKE 1 TABLET BY MOUTH EVERY DAY 90 tablet 3   No current facility-administered medications for this visit.      Allergies:   Patient has no known allergies.   Social History:  The patient  reports that she has quit smoking. She has never used smokeless tobacco. She reports that she does not drink alcohol and does not use drugs.   Family History:   family history includes Breast cancer (age of onset: 89) in her mother; Breast cancer (age of onset: 28) in her paternal uncle; Diabetes in her paternal grandfather; Heart block in her father; Heart disease in her maternal grandfather and maternal grandmother; Hypertension in her maternal grandfather, maternal grandmother, and mother; Stroke in her maternal grandfather and maternal grandmother.    Review of Systems: Review of Systems  Constitutional: Negative.   HENT: Negative.   Respiratory: Negative.   Cardiovascular: Negative.   Gastrointestinal: Negative.   Musculoskeletal: Negative.   Neurological: Negative.   Psychiatric/Behavioral: Negative.   All other systems reviewed and are negative.   PHYSICAL EXAM: VS:  BP 132/80 (BP Location: Right Arm, Patient Position: Sitting, Cuff Size: Normal)   Pulse 71   Ht 5\' 1"  (1.549 m)   Wt 143 lb 8 oz (65.1 kg)   LMP 06/26/2009   SpO2 99%   BMI 27.11 kg/m  , BMI Body mass index is 27.11 kg/m. GEN: Well nourished, well developed, in no acute distress HEENT: normal Neck: no JVD, carotid bruits, or masses Cardiac: RRR; no murmurs, rubs, or gallops,no edema  Respiratory:  clear to auscultation bilaterally, normal work of breathing GI:  soft, nontender, nondistended, + BS Dawn: no deformity or atrophy Skin: warm and dry, no rash Neuro:  Strength and sensation are intact Psych: euthymic mood, full affect  Recent Labs: 10/10/2019: Hemoglobin 12.8; Platelets 346.0 05/25/2020: ALT 27; BUN 12; Creatinine, Ser 0.87; Potassium 4.3; Sodium 140; TSH 4.06    Lipid Panel Lab Results  Component Value Date   CHOL 294 (H) 05/25/2020   HDL 42.40 05/25/2020   LDLCALC 187 (H) 10/10/2019   TRIG (H) 05/25/2020     441.0 Triglyceride is over 400; calculations on Lipids are invalid.      Wt Readings from Last 3 Encounters:  07/09/20 143 lb 8 oz (65.1 kg)  05/03/20 137 lb (62.1 kg)  09/26/19 137 lb 3.2 oz (62.2 kg)     ASSESSMENT AND PLAN:  Problem List Items Addressed This Visit      Cardiology Problems   Hypercholesterolemia - Primary   Relevant Orders   EKG 12-Lead   CT CARDIAC SCORING (SELF PAY ONLY)   Essential hypertension   Relevant Orders   EKG 12-Lead     Other   GERD (gastroesophageal reflux disease)   Family history of early CAD   Relevant Orders   CT CARDIAC SCORING (SELF PAY ONLY)    Other Visit Diagnoses    Hyperlipidemia, unspecified hyperlipidemia type       Relevant Orders   CT CARDIAC SCORING (SELF PAY ONLY)     Hyperlipidemia Likely familial in nature Risk stratification recommended with CT coronary calcium scoring Denies any anginal symptoms, good exercise tolerance -Discussed possible options, if calcium score is low, lifestyle modification recommended, could consider statin/Zetia For elevated score, consider statin/Zetia Currently no indication for ischemic work-up  Hypertension Recommend monitoring blood pressure at home No medication changes made For low blood pressure, could consider trying to wean off the losartan with close monitoring of blood pressure at home    Total encounter time more than 45 minutes  Greater than 50% was spent in counseling and coordination of care with the patient    Signed, Dossie Arbour, M.D., Ph.D. [facility] [place], [place] [phone]

## 2020-07-15 ENCOUNTER — Ambulatory Visit
Admission: RE | Admit: 2020-07-15 | Discharge: 2020-07-15 | Disposition: A | Discharge status: Home / Self Care | Payer: Managed Care, Other (non HMO) | Source: Ambulatory Visit | Attending: Cardiovascular Disease | Admitting: Cardiovascular Disease

## 2020-07-15 ENCOUNTER — Other Ambulatory Visit: Payer: Self-pay

## 2020-07-15 DIAGNOSIS — Z8249 Family history of ischemic heart disease and other diseases of the circulatory system: Secondary | ICD-10-CM | POA: Insufficient documentation

## 2020-07-15 DIAGNOSIS — E7800 Pure hypercholesterolemia, unspecified: Secondary | ICD-10-CM | POA: Insufficient documentation

## 2020-07-15 DIAGNOSIS — E785 Hyperlipidemia, unspecified: Secondary | ICD-10-CM | POA: Insufficient documentation

## 2020-07-23 ENCOUNTER — Other Ambulatory Visit: Payer: Self-pay | Admitting: Internal Medicine

## 2020-07-23 MED ORDER — CYCLOBENZAPRINE HCL 10 MG PO TABS: 10.0000 mg | ORAL_TABLET | Freq: Every day | ORAL | 0 refills | Status: DC

## 2020-07-27 ENCOUNTER — Telehealth: Payer: Self-pay

## 2020-07-27 NOTE — Telephone Encounter (Signed)
Attempted to reach pt via phone, unable to contacted pt, LDM on VM (DPR approved)  "CT coronary calcium score  Score of 0  Excellent finding, indicating no calcified coronary atherosclerotic plaque noted  No other significant findings on the scan were picked up"  Pt has also seen her results by Dr. Mariah Milling on her MyChart, advised on VM to call back for any additional concerns or questions.

## 2020-09-20 ENCOUNTER — Telehealth: Payer: Self-pay | Admitting: Internal Medicine

## 2020-09-20 NOTE — Telephone Encounter (Signed)
Received notification that she was overdue mammogram.  Last 2020.  Need to schedule.

## 2020-09-20 NOTE — Telephone Encounter (Signed)
LMTCB

## 2020-09-21 ENCOUNTER — Encounter: Payer: Self-pay | Admitting: Gastroenterology

## 2020-09-21 NOTE — Telephone Encounter (Signed)
Pt returned your call.  

## 2020-09-21 NOTE — Telephone Encounter (Signed)
Pt was informed of mammogram being due and is going to call and schedule

## 2020-09-22 ENCOUNTER — Encounter: Payer: Self-pay | Admitting: Anesthesiology

## 2020-09-24 ENCOUNTER — Other Ambulatory Visit: Payer: Self-pay | Admitting: Internal Medicine

## 2020-09-24 DIAGNOSIS — Z1231 Encounter for screening mammogram for malignant neoplasm of breast: Secondary | ICD-10-CM

## 2020-10-01 ENCOUNTER — Ambulatory Visit
Admission: RE | Admit: 2020-10-01 | Payer: Managed Care, Other (non HMO) | Source: Home / Self Care | Admitting: Gastroenterology

## 2020-10-01 HISTORY — DX: Hypothyroidism, unspecified: E03.9

## 2020-10-01 SURGERY — COLONOSCOPY WITH PROPOFOL
Anesthesia: Choice

## 2020-10-04 ENCOUNTER — Other Ambulatory Visit: Payer: Self-pay | Admitting: Internal Medicine

## 2020-10-05 MED ORDER — CYCLOBENZAPRINE HCL 10 MG PO TABS
10.0000 mg | ORAL_TABLET | Freq: Every day | ORAL | 0 refills | Status: DC
Start: 2020-10-05 — End: 2021-03-06

## 2020-10-06 ENCOUNTER — Ambulatory Visit
Admission: RE | Admit: 2020-10-06 | Discharge: 2020-10-06 | Disposition: A | Discharge status: Home / Self Care | Payer: Managed Care, Other (non HMO) | Source: Ambulatory Visit | Attending: Internal Medicine | Admitting: Internal Medicine

## 2020-10-06 ENCOUNTER — Other Ambulatory Visit: Payer: Self-pay

## 2020-10-06 DIAGNOSIS — Z1231 Encounter for screening mammogram for malignant neoplasm of breast: Secondary | ICD-10-CM

## 2020-10-14 ENCOUNTER — Encounter: Payer: Self-pay | Admitting: Internal Medicine

## 2020-11-09 ENCOUNTER — Encounter: Payer: Managed Care, Other (non HMO) | Admitting: Internal Medicine

## 2020-12-31 ENCOUNTER — Other Ambulatory Visit: Payer: Self-pay | Admitting: Internal Medicine

## 2021-01-08 ENCOUNTER — Other Ambulatory Visit: Payer: Self-pay | Admitting: Internal Medicine

## 2021-01-17 ENCOUNTER — Other Ambulatory Visit (HOSPITAL_COMMUNITY)
Admission: RE | Admit: 2021-01-17 | Discharge: 2021-01-17 | Disposition: A | Discharge status: Home / Self Care | Payer: Managed Care, Other (non HMO) | Source: Ambulatory Visit | Attending: Internal Medicine | Admitting: Internal Medicine

## 2021-01-17 ENCOUNTER — Ambulatory Visit (INDEPENDENT_AMBULATORY_CARE_PROVIDER_SITE_OTHER): Payer: Managed Care, Other (non HMO) | Admitting: Internal Medicine

## 2021-01-17 ENCOUNTER — Other Ambulatory Visit: Payer: Self-pay

## 2021-01-17 ENCOUNTER — Encounter: Payer: Self-pay | Admitting: Internal Medicine

## 2021-01-17 VITALS — BP 118/70 | HR 79 | Temp 97.2°F | Resp 16 | Ht 61.0 in | Wt 142.8 lb

## 2021-01-17 DIAGNOSIS — R748 Abnormal levels of other serum enzymes: Secondary | ICD-10-CM

## 2021-01-17 DIAGNOSIS — Z8249 Family history of ischemic heart disease and other diseases of the circulatory system: Secondary | ICD-10-CM

## 2021-01-17 DIAGNOSIS — E039 Hypothyroidism, unspecified: Secondary | ICD-10-CM

## 2021-01-17 DIAGNOSIS — Z124 Encounter for screening for malignant neoplasm of cervix: Secondary | ICD-10-CM

## 2021-01-17 DIAGNOSIS — Z0000 Encounter for general adult medical examination without abnormal findings: Secondary | ICD-10-CM

## 2021-01-17 DIAGNOSIS — I10 Essential (primary) hypertension: Secondary | ICD-10-CM

## 2021-01-17 DIAGNOSIS — E7800 Pure hypercholesterolemia, unspecified: Secondary | ICD-10-CM | POA: Diagnosis not present

## 2021-01-17 DIAGNOSIS — Z23 Encounter for immunization: Secondary | ICD-10-CM

## 2021-01-17 DIAGNOSIS — K219 Gastro-esophageal reflux disease without esophagitis: Secondary | ICD-10-CM

## 2021-01-17 DIAGNOSIS — F439 Reaction to severe stress, unspecified: Secondary | ICD-10-CM

## 2021-01-17 LAB — BASIC METABOLIC PANEL
BUN: 11 mg/dL (ref 6–23)
CO2: 33 mEq/L — ABNORMAL HIGH (ref 19–32)
Calcium: 11.1 mg/dL — ABNORMAL HIGH (ref 8.4–10.5)
Chloride: 97 mEq/L (ref 96–112)
Creatinine, Ser: 0.8 mg/dL (ref 0.40–1.20)
GFR: 84.03 mL/min (ref >60.00)
Glucose, Bld: 98 mg/dL (ref 70–99)
Potassium: 3.1 mEq/L — ABNORMAL LOW (ref 3.5–5.1)
Sodium: 139 mEq/L (ref 135–145)

## 2021-01-17 LAB — LIPID PANEL
Cholesterol: 300 mg/dL — ABNORMAL HIGH (ref 0–200)
HDL: 44.4 mg/dL (ref >39.00)
NonHDL: 255.76
Total CHOL/HDL Ratio: 7
Triglycerides: 373 mg/dL — ABNORMAL HIGH (ref 0.0–149.0)
VLDL: 74.6 mg/dL — ABNORMAL HIGH (ref 0.0–40.0)

## 2021-01-17 LAB — CBC WITH DIFFERENTIAL/PLATELET
Basophils Absolute: 0.1 10*3/uL (ref 0.0–0.1)
Basophils Relative: 0.9 % (ref 0.0–3.0)
Eosinophils Absolute: 0.1 10*3/uL (ref 0.0–0.7)
Eosinophils Relative: 1.6 % (ref 0.0–5.0)
HCT: 39.3 % (ref 36.0–46.0)
Hemoglobin: 13.3 g/dL (ref 12.0–15.0)
Lymphocytes Relative: 28.9 % (ref 12.0–46.0)
Lymphs Abs: 2.1 10*3/uL (ref 0.7–4.0)
MCHC: 33.8 g/dL (ref 30.0–36.0)
MCV: 89.5 fl (ref 78.0–100.0)
Monocytes Absolute: 0.5 10*3/uL (ref 0.1–1.0)
Monocytes Relative: 7.1 % (ref 3.0–12.0)
Neutro Abs: 4.4 10*3/uL (ref 1.4–7.7)
Neutrophils Relative %: 61.5 % (ref 43.0–77.0)
Platelets: 317 10*3/uL (ref 150.0–400.0)
RBC: 4.4 Mil/uL (ref 3.87–5.11)
RDW: 13.5 % (ref 11.5–15.5)
WBC: 7.2 10*3/uL (ref 4.0–10.5)

## 2021-01-17 LAB — HEPATIC FUNCTION PANEL
ALT: 26 U/L (ref 0–35)
AST: 23 U/L (ref 0–37)
Albumin: 4.8 g/dL (ref 3.5–5.2)
Alkaline Phosphatase: 63 U/L (ref 39–117)
Bilirubin, Direct: 0.1 mg/dL (ref 0.0–0.3)
Total Bilirubin: 0.4 mg/dL (ref 0.2–1.2)
Total Protein: 7.9 g/dL (ref 6.0–8.3)

## 2021-01-17 LAB — LDL CHOLESTEROL, DIRECT: Direct LDL: 164 mg/dL

## 2021-01-17 NOTE — Assessment & Plan Note (Signed)
Physical today 01/17/21.  PAP 01/17/21. Mammogram 10/08/20 - Birads I.  She will reschedule colonoscopy.

## 2021-01-17 NOTE — Progress Notes (Signed)
[name], female   DOB: 05-03-1965, 55 y.o.   MRN: [id number]   Subjective:    [name], female    DOB: 1965/06/17, 55 y.o.   MRN: [id number]  This visit occurred during the SARS-CoV-2 public health emergency.  Safety protocols were in place, including screening questions prior to the visit, additional usage of staff PPE, and extensive cleaning of exam room while observing appropriate contact time as indicated for disinfecting solutions.   Patient here for her physical exam.   Chief Complaint  Patient presents with   Annual Exam   .   HPI She reports she is doing relatively well.  Handling stress.  Has good support.  Does not feel needs anything more at this time.  No chest pain or sob reported.  No abdominal pain or bowel change reported.  Neck - stable/improved.     Past Medical History:  Diagnosis Date   GERD (gastroesophageal reflux disease)    Hypertension    Hypothyroidism    Past Surgical History:  Procedure Laterality Date   ABDOMINAL HYSTERECTOMY  2011   BREAST BIOPSY Left 12/26/2007   Dr Evette Cristal- neg   CHOLECYSTECTOMY  03/2012   Dr Malissa Hippo   UPPER GI ENDOSCOPY     upper Endoscopy (10-15-2010)    Family History  Problem Relation Age of Onset   Hypertension Mother    Breast cancer Mother 82       negative genetic testing   Heart block Father    Breast cancer Maternal Uncle    Heart disease Maternal Grandmother    Stroke Maternal Grandmother    Hypertension Maternal Grandmother    Heart disease Maternal Grandfather    Stroke Maternal Grandfather    Hypertension Maternal Grandfather    Diabetes Paternal Grandfather    Social History   Socioeconomic History   Marital status: Married    Spouse name: John   Number of children: 2   Years of education: Not on file   Highest education level: Not on file  Occupational History    Employer: [employer]  Tobacco Use   Smoking status: Former   Smokeless tobacco:  Never   Tobacco comments:    smoked in Therapist, art Use: Never used  Substance and Sexual Activity   Alcohol use: No    Alcohol/week: 0.0 standard drinks   Drug use: No   Sexual activity: Not on file  Other Topics Concern   Not on file  Social History Narrative   Not on file   Social Determinants of Health   Financial Resource Strain: Not on file  Food Insecurity: Not on file  Transportation Needs: Not on file  Physical Activity: Not on file  Stress: Not on file  Social Connections: Not on file     Review of Systems  Constitutional:  Negative for appetite change and unexpected weight change.  HENT:  Negative for congestion, sinus pressure and sore throat.   Eyes:  Negative for pain and visual disturbance.  Respiratory:  Negative for cough, chest tightness and shortness of breath.   Cardiovascular:  Negative for chest pain, palpitations and leg swelling.  Gastrointestinal:  Negative for abdominal pain, diarrhea, nausea and vomiting.  Genitourinary:  Negative for difficulty urinating and dysuria.  Musculoskeletal:  Negative for joint swelling and myalgias.  Skin:  Negative for color change and rash.  Neurological:  Negative for dizziness, light-headedness and headaches.  Hematological:  Negative  for adenopathy. Does not bruise/bleed easily.  Psychiatric/Behavioral:  Negative for agitation and dysphoric mood.       Objective:     BP 118/70   Pulse 79   Temp (!) 97.2 F (36.2 C)   Resp 16   Ht 5\' 1"  (1.549 m)   Wt 142 lb 12.8 oz (64.8 kg)   LMP 06/26/2009   SpO2 99%   BMI 26.98 kg/m  Wt Readings from Last 3 Encounters:  01/17/21 142 lb 12.8 oz (64.8 kg)  07/09/20 143 lb 8 oz (65.1 kg)  05/03/20 137 lb (62.1 kg)    Physical Exam Vitals reviewed.  Constitutional:      General: She is not in acute distress.    Appearance: Normal appearance. She is well-developed.  HENT:     Head: Normocephalic and atraumatic.     Right Ear: External ear  normal.     Left Ear: External ear normal.  Eyes:     General: No scleral icterus.       Right eye: No discharge.        Left eye: No discharge.     Conjunctiva/sclera: Conjunctivae normal.  Neck:     Thyroid: No thyromegaly.  Cardiovascular:     Rate and Rhythm: Normal rate and regular rhythm.  Pulmonary:     Effort: No tachypnea, accessory muscle usage or respiratory distress.     Breath sounds: Normal breath sounds. No decreased breath sounds or wheezing.  Chest:  Breasts:    Right: No inverted nipple, mass, nipple discharge or tenderness (no axillary adenopathy).     Left: No inverted nipple, mass, nipple discharge or tenderness (no axilarry adenopathy).  Abdominal:     General: Bowel sounds are normal.     Palpations: Abdomen is soft.     Tenderness: There is no abdominal tenderness.  Genitourinary:    Comments: Normal external genitalia.  Vaginal vault without lesions.  Cervix identified.  Pap smear performed.  Could not appreciate any adnexal masses or tenderness.   Musculoskeletal:        General: No swelling or tenderness.     Cervical back: Neck supple.  Lymphadenopathy:     Cervical: No cervical adenopathy.  Skin:    Findings: No erythema or rash.  Neurological:     Mental Status: She is alert and oriented to person, place, and time.  Psychiatric:        Mood and Affect: Mood normal.        Behavior: Behavior normal.     Outpatient Encounter Medications as of 01/17/2021  Medication Sig   potassium chloride (KLOR-CON) 10 MEQ tablet kcl 01/19/2021 bid x 2 days and then one po q day   Ascorbic Acid (VITAMIN C PO) Take by mouth.   cyclobenzaprine (FLEXERIL) 10 MG tablet Take 1 tablet (10 mg total) by mouth at bedtime.   diphenhydrAMINE HCl (BENADRYL ALLERGY PO) Take by mouth at bedtime as needed.   esomeprazole (NEXIUM) 20 MG capsule Take 20 mg by mouth daily at 12 noon.   levothyroxine (SYNTHROID) 75 MCG tablet TAKE 1 TABLET BY MOUTH DAILY BEFORE BREAKFAST.    losartan (COZAAR) 25 MG tablet TAKE 1 TABLET BY MOUTH EVERY DAY   Multiple Vitamins-Minerals (MULTIVITAMIN WOMEN PO) Take by mouth.   Omega-3 Fatty Acids (OMEGA-3 FISH OIL PO) Take by mouth.   VITAMIN D PO Take by mouth.   No facility-administered encounter medications on file as of 01/17/2021.     Lab Results  Component  Value Date   WBC 7.2 01/17/2021   HGB 13.3 01/17/2021   HCT 39.3 01/17/2021   PLT 317.0 01/17/2021   GLUCOSE 98 01/17/2021   CHOL 300 (H) 01/17/2021   TRIG 373.0 (H) 01/17/2021   HDL 44.40 01/17/2021   LDLDIRECT 164.0 01/17/2021   LDLCALC 187 (H) 10/10/2019   ALT 26 01/17/2021   AST 23 01/17/2021   NA 139 01/17/2021   K 3.1 (L) 01/17/2021   CL 97 01/17/2021   CREATININE 0.80 01/17/2021   BUN 11 01/17/2021   CO2 33 (H) 01/17/2021   TSH 4.06 05/25/2020    MM 3D SCREEN BREAST BILATERAL  Result Date: 10/08/2020 CLINICAL DATA:  Screening. EXAM: DIGITAL SCREENING BILATERAL MAMMOGRAM WITH TOMOSYNTHESIS AND CAD TECHNIQUE: Bilateral screening digital craniocaudal and mediolateral oblique mammograms were obtained. Bilateral screening digital breast tomosynthesis was performed. The images were evaluated with computer-aided detection. COMPARISON:  Previous exam(s). ACR Breast Density Category c: The breast tissue is heterogeneously dense, which may obscure small masses. FINDINGS: There are no findings suspicious for malignancy. IMPRESSION: No mammographic evidence of malignancy. A result letter of this screening mammogram will be mailed directly to the patient. RECOMMENDATION: Screening mammogram in one year. (Code:SM-B-01Y) BI-RADS CATEGORY  1: Negative. Electronically Signed   By: Annia Belt M.D.   On: 10/08/2020 16:07      Assessment & Plan:   Problem List Items Addressed This Visit     Abnormal liver enzymes    Had elevation after starting cholesterol medication.  Low cholesterol diet.  Is walking.  Recheck liver panel with next labs.        Essential  hypertension    Has been on losartan.  Follow pressures.  Schedule metabolic panel.       Relevant Orders   CBC with Differential/Platelet (Completed)   Basic metabolic panel (Completed)   Family history of early CAD    Saw cardiology.  Calcium score 0.  Follow.       GERD (gastroesophageal reflux disease)    No upper symptoms reported.  Nexium.       Health care maintenance    Physical today 01/17/21.  PAP 01/17/21. Mammogram 10/08/20 - Birads I.  She will reschedule colonoscopy.        Hypercholesterolemia    She is walking regularly.  Watching her diet. Check lipid panel.  Calcium score - 0 - on recent check.       Relevant Orders   Hepatic function panel (Completed)   Lipid panel (Completed)   Hypothyroidism    On thyroid replacement.  Follow tsh.        Stress    Overall appears to be handling things well. Follow.       Other Visit Diagnoses     Routine general medical examination at a health care facility    -  Primary   Cervical cancer screening       Relevant Orders   Cytology - PAP( [place]) (Completed)   Need for immunization against influenza       Relevant Orders   Flu Vaccine QUAD 70mo+IM (Fluarix, Fluzone & Alfiuria Quad PF) (Completed)        Dale Reading, MD

## 2021-01-18 LAB — CYTOLOGY - PAP
Comment: NEGATIVE
Diagnosis: NEGATIVE
High risk HPV: NEGATIVE

## 2021-01-19 ENCOUNTER — Other Ambulatory Visit: Payer: Self-pay | Admitting: Internal Medicine

## 2021-01-19 DIAGNOSIS — E875 Hyperkalemia: Secondary | ICD-10-CM

## 2021-01-19 MED ORDER — POTASSIUM CHLORIDE CRYS ER 10 MEQ PO TBCR: EXTENDED_RELEASE_TABLET | ORAL | 0 refills | Status: DC

## 2021-01-19 NOTE — Progress Notes (Signed)
Orders placed for labs

## 2021-01-24 ENCOUNTER — Encounter: Payer: Self-pay | Admitting: Internal Medicine

## 2021-01-24 NOTE — Assessment & Plan Note (Signed)
She is walking regularly.  Watching her diet. Check lipid panel.  Calcium score - 0 - on recent check.

## 2021-01-24 NOTE — Assessment & Plan Note (Signed)
Had elevation after starting cholesterol medication.  Low cholesterol diet.  Is walking.  Recheck liver panel with next labs.

## 2021-01-24 NOTE — Assessment & Plan Note (Signed)
No upper symptoms reported.  Nexium.  

## 2021-01-24 NOTE — Assessment & Plan Note (Signed)
On thyroid replacement.  Follow tsh.  

## 2021-01-24 NOTE — Assessment & Plan Note (Signed)
Overall appears to be handling things well.  Follow.  ?

## 2021-01-24 NOTE — Assessment & Plan Note (Signed)
Has been on losartan.  Follow pressures.  Schedule metabolic panel.

## 2021-01-24 NOTE — Assessment & Plan Note (Signed)
Saw cardiology.  Calcium score 0.  Follow.  

## 2021-01-26 ENCOUNTER — Other Ambulatory Visit (INDEPENDENT_AMBULATORY_CARE_PROVIDER_SITE_OTHER): Payer: Managed Care, Other (non HMO)

## 2021-01-26 ENCOUNTER — Other Ambulatory Visit: Payer: Self-pay

## 2021-01-26 DIAGNOSIS — E875 Hyperkalemia: Secondary | ICD-10-CM | POA: Diagnosis not present

## 2021-01-26 LAB — POTASSIUM: Potassium: 4.5 mEq/L (ref 3.5–5.1)

## 2021-01-26 LAB — VITAMIN D 25 HYDROXY (VIT D DEFICIENCY, FRACTURES): VITD: 45.22 ng/mL (ref 30.00–100.00)

## 2021-01-27 LAB — EXTRA SPECIMEN

## 2021-01-27 LAB — PARATHYROID HORMONE, INTACT (NO CA): PTH: 30 pg/mL (ref 16–77)

## 2021-01-27 LAB — CALCIUM: Calcium: 10.3 mg/dL (ref 8.4–10.5)

## 2021-01-27 NOTE — Addendum Note (Signed)
Addended by: Clearnce Sorrel on: 01/27/2021 09:09 AM   Modules accepted: Orders

## 2021-01-28 ENCOUNTER — Other Ambulatory Visit: Payer: Self-pay | Admitting: Internal Medicine

## 2021-01-28 DIAGNOSIS — E876 Hypokalemia: Secondary | ICD-10-CM

## 2021-01-28 LAB — CALCIUM, IONIZED: Calcium, Ion: 5.3 mg/dL (ref 4.8–5.6)

## 2021-01-28 NOTE — Progress Notes (Signed)
Order placed for f/u potassium.  

## 2021-02-08 ENCOUNTER — Other Ambulatory Visit: Payer: Self-pay | Admitting: Internal Medicine

## 2021-02-22 ENCOUNTER — Other Ambulatory Visit: Payer: Self-pay | Admitting: Internal Medicine

## 2021-02-25 ENCOUNTER — Other Ambulatory Visit (INDEPENDENT_AMBULATORY_CARE_PROVIDER_SITE_OTHER): Payer: Managed Care, Other (non HMO)

## 2021-02-25 ENCOUNTER — Other Ambulatory Visit: Payer: Self-pay

## 2021-02-25 DIAGNOSIS — E876 Hypokalemia: Secondary | ICD-10-CM | POA: Diagnosis not present

## 2021-02-25 LAB — POTASSIUM: Potassium: 4.3 mEq/L (ref 3.5–5.1)

## 2021-02-28 ENCOUNTER — Encounter: Payer: Self-pay | Admitting: Internal Medicine

## 2021-03-06 ENCOUNTER — Other Ambulatory Visit: Payer: Self-pay | Admitting: Internal Medicine

## 2021-03-07 MED ORDER — CYCLOBENZAPRINE HCL 10 MG PO TABS
10.0000 mg | ORAL_TABLET | Freq: Every day | ORAL | 0 refills | Status: DC
Start: 2021-03-07 — End: 2021-07-07

## 2021-03-19 ENCOUNTER — Encounter: Payer: Self-pay | Admitting: Internal Medicine

## 2021-05-20 ENCOUNTER — Ambulatory Visit: Payer: Managed Care, Other (non HMO) | Admitting: Internal Medicine

## 2021-06-03 ENCOUNTER — Ambulatory Visit: Payer: Managed Care, Other (non HMO) | Admitting: Internal Medicine

## 2021-06-11 ENCOUNTER — Ambulatory Visit
Admission: EM | Admit: 2021-06-11 | Discharge: 2021-06-11 | Disposition: A | Discharge status: Home / Self Care | Payer: Managed Care, Other (non HMO) | Attending: Physician Assistant | Admitting: Physician Assistant

## 2021-06-11 ENCOUNTER — Other Ambulatory Visit: Payer: Self-pay

## 2021-06-11 DIAGNOSIS — B001 Herpesviral vesicular dermatitis: Secondary | ICD-10-CM | POA: Diagnosis not present

## 2021-06-11 DIAGNOSIS — H6011 Cellulitis of right external ear: Secondary | ICD-10-CM | POA: Diagnosis not present

## 2021-06-11 DIAGNOSIS — H6123 Impacted cerumen, bilateral: Secondary | ICD-10-CM

## 2021-06-11 DIAGNOSIS — H9201 Otalgia, right ear: Secondary | ICD-10-CM | POA: Diagnosis not present

## 2021-06-11 MED ORDER — CEFDINIR 300 MG PO CAPS
300.0000 mg | ORAL_CAPSULE | Freq: Two times a day (BID) | ORAL | 0 refills | Status: AC
Start: 2021-06-11 — End: 2021-06-18

## 2021-06-11 MED ORDER — VALACYCLOVIR HCL 1 G PO TABS: 2000.0000 mg | ORAL_TABLET | Freq: Two times a day (BID) | ORAL | 0 refills | Status: AC

## 2021-06-11 MED ORDER — OFLOXACIN 0.3 % OT SOLN: 10.0000 [drp] | Freq: Every day | OTIC | 0 refills | Status: AC

## 2021-06-11 NOTE — ED Provider Notes (Signed)
?[facility] ? ? ? ?CSN: [id number] ?Arrival date & time: 06/11/21  1427 ? ? ?  ? ?History   ?Chief Complaint ?Chief Complaint  ?Patient presents with  ? Otalgia  ? Mouth Lesions  ? ? ?HPI ?Dawn Lynn is a 56 y.o. female presenting for right-sided ear pain for the past couple of days.  She says she has increased pain when she lies on the right side of her ear and touches the tragus.  No drainage from ear and hearing is normal.  No recent illness.  Denies cough, congestion, sore throat, sinus pain/pressure.  No injury to ear.  Patient also reports sores on her lips for the past couple days and a sore on her tongue.  No fever.  No other complaints. ? ?HPI ? ?Past Medical History:  ?Diagnosis Date  ? GERD (gastroesophageal reflux disease)   ? Hypertension   ? Hypothyroidism   ? ? ?Patient Active Problem List  ? Diagnosis Date Noted  ? Colon cancer screening 05/03/2020  ? Essential hypertension 07/09/2016  ? Neck pain 03/05/2016  ? Abnormal liver enzymes 02/29/2016  ? Trapezius strain 01/21/2016  ? Family history of early CAD 11/29/2015  ? Travel advice encounter 04/25/2015  ? Breast cyst 10/17/2014  ? Sleep disturbance 09/24/2014  ? Head pain 09/24/2014  ? Stress 09/24/2014  ? Health care maintenance 09/24/2014  ? Hypercholesterolemia 09/03/2013  ? Hypothyroidism 09/04/2012  ? GERD (gastroesophageal reflux disease) 06/26/2012  ? Chronic chest pain 06/26/2012  ? ? ?Past Surgical History:  ?Procedure Laterality Date  ? ABDOMINAL HYSTERECTOMY  2011  ? BREAST BIOPSY Left 12/26/2007  ? Dr Jamal Collin- neg  ? CHOLECYSTECTOMY  03/2012  ? Dr Nicholes Stairs  ? UPPER GI ENDOSCOPY    ? upper Endoscopy (10-15-2010)   ? ? ?OB History   ? ? Gravida  ?2  ? Para  ?2  ? Term  ?   ? Preterm  ?   ? AB  ?   ? Living  ?   ?  ? ? SAB  ?   ? IAB  ?   ? Ectopic  ?   ? Multiple  ?   ? Live Births  ?   ?   ?  ?  ? ? ? ?Home Medications   ? ?Prior to Admission medications   ?Medication Sig Start Date End Date Taking? Authorizing Provider   ?cefdinir (OMNICEF) 300 MG capsule Take 1 capsule (300 mg total) by mouth 2 (two) times daily for 7 days. 06/11/21 06/18/21 Yes Laurene Footman B, PA-C  ?levothyroxine (SYNTHROID) 75 MCG tablet TAKE 1 TABLET BY MOUTH DAILY BEFORE BREAKFAST. 01/10/21  Yes Einar Pheasant, MD  ?losartan (COZAAR) 25 MG tablet TAKE 1 TABLET BY MOUTH EVERY DAY 12/31/20  Yes Einar Pheasant, MD  ?ofloxacin (FLOXIN) 0.3 % OTIC solution Place 10 drops into both ears daily for 7 days. 06/11/21 06/18/21 Yes Danton Clap, PA-C  ?valACYclovir (VALTREX) 1000 MG tablet Take 2 tablets (2,000 mg total) by mouth 2 (two) times daily for 1 day. 06/11/21 06/12/21 Yes Danton Clap, PA-C  ?Ascorbic Acid (VITAMIN C PO) Take by mouth.    [provider]  ?cyclobenzaprine (FLEXERIL) 10 MG tablet Take 1 tablet (10 mg total) by mouth at bedtime. 03/07/21   Einar Pheasant, MD  ?diphenhydrAMINE HCl (BENADRYL ALLERGY PO) Take by mouth at bedtime as needed.    [provider]  ?esomeprazole (NEXIUM) 20 MG capsule Take 20 mg by mouth  daily at 12 noon.    [provider]  ?Multiple Vitamins-Minerals (MULTIVITAMIN WOMEN PO) Take by mouth.    [provider]  ?Omega-3 Fatty Acids (OMEGA-3 FISH OIL PO) Take by mouth.    [provider]  ?potassium chloride (KLOR-CON M10) 10 MEQ tablet TAKE 1 TABLET BY MOUTH TWICE A DAY FOR 2 DAYS THEN TAKE 1 TABLET BY MOUTH EVERY DAY 02/22/21   Einar Pheasant, MD  ?VITAMIN D PO Take by mouth.    [provider]  ? ? ?Family History ?Family History  ?Problem Relation Age of Onset  ? Hypertension Mother   ? Breast cancer Mother 23  ?     negative genetic testing  ? Heart block Father   ? Breast cancer Maternal Uncle   ? Heart disease Maternal Grandmother   ? Stroke Maternal Grandmother   ? Hypertension Maternal Grandmother   ? Heart disease Maternal Grandfather   ? Stroke Maternal Grandfather   ? Hypertension Maternal Grandfather   ? Diabetes Paternal Grandfather   ? ? ?Social  History ?Social History  ? ?Tobacco Use  ? Smoking status: Former  ? Smokeless tobacco: Never  ? Tobacco comments:  ?  smoked in college  ?Vaping Use  ? Vaping Use: Never used  ?Substance Use Topics  ? Alcohol use: No  ?  Alcohol/week: 0.0 standard drinks  ? Drug use: No  ? ? ? ?Allergies   ?Patient has no known allergies. ? ? ?Review of Systems ?Review of Systems  ?Constitutional:  Negative for chills, diaphoresis, fatigue and fever.  ?HENT:  Positive for ear pain and mouth sores. Negative for congestion, rhinorrhea, sinus pressure, sinus pain and sore throat.   ?Respiratory:  Negative for cough.   ?Gastrointestinal:  Negative for nausea and vomiting.  ?Musculoskeletal:  Negative for myalgias.  ?Skin:  Negative for rash.  ?Neurological:  Negative for weakness and headaches.  ?Hematological:  Negative for adenopathy.  ? ? ?Physical Exam ?Triage Vital Signs ?ED Triage Vitals  ?Enc Vitals Group  ?   BP 06/11/21 1445 136/86  ?   Pulse Rate 06/11/21 1445 79  ?   Resp 06/11/21 1445 18  ?   Temp 06/11/21 1445 98.2 ?F (36.8 ?C)  ?   Temp Source 06/11/21 1445 Oral  ?   SpO2 06/11/21 1445 98 %  ?   Weight 06/11/21 1443 142 lb (64.4 kg)  ?   Height 06/11/21 1443 5\' 2"  (1.575 m)  ?   Head Circumference --   ?   Peak Flow --   ?   Pain Score 06/11/21 1443 0  ?   Pain Loc --   ?   Pain Edu? --   ?   Excl. in [place]? --   ? ?No data found. ? ?Updated Vital Signs ?BP 136/86 (BP Location: Left Arm)   Pulse 79   Temp 98.2 ?F (36.8 ?C) (Oral)   Resp 18   Ht 5\' 2"  (1.575 m)   Wt 142 lb (64.4 kg)   LMP 06/26/2009   SpO2 98%   BMI 25.97 kg/m?  ?  ? ?Physical Exam ?Vitals and nursing note reviewed.  ?Constitutional:   ?   General: She is not in acute distress. ?   Appearance: Normal appearance. She is not ill-appearing or toxic-appearing.  ?HENT:  ?   Head: Normocephalic and atraumatic.  ?   Right Ear: There is impacted cerumen.  ?   Left Ear: There is impacted cerumen.  ?  Ears:  ?   Comments: Mild erythema and swelling of the  right tragus and tenderness to palpation of tragus. ?   Nose: Nose normal.  ?   Mouth/Throat:  ?   Mouth: Mucous membranes are moist.  ?   Pharynx: Oropharynx is clear.  ?   Comments: 2 ulcerated lesions with crusting of lower lip.  1 ulceration with gray base on tongue. ?Eyes:  ?   General: No scleral icterus.    ?   Right eye: No discharge.     ?   Left eye: No discharge.  ?   Conjunctiva/sclera: Conjunctivae normal.  ?Cardiovascular:  ?   Rate and Rhythm: Normal rate and regular rhythm.  ?   Heart sounds: Normal heart sounds.  ?Pulmonary:  ?   Effort: Pulmonary effort is normal. No respiratory distress.  ?   Breath sounds: Normal breath sounds.  ?Musculoskeletal:  ?   Cervical back: Neck supple.  ?Skin: ?   General: Skin is dry.  ?Neurological:  ?   General: No focal deficit present.  ?   Mental Status: She is alert. Mental status is at baseline.  ?   Motor: No weakness.  ?   Gait: Gait normal.  ?Psychiatric:     ?   Mood and Affect: Mood normal.     ?   Behavior: Behavior normal.     ?   Thought Content: Thought content normal.  ? ? ? ?UC Treatments / Results  ?Labs ?(all labs ordered are listed, but only abnormal results are displayed) ?Labs Reviewed - No data to display ? ?EKG ? ? ?Radiology ?No results found. ? ?Procedures ?Procedures (including critical care time) ? ?Medications Ordered in UC ?Medications - No data to display ? ?Initial Impression / Assessment and Plan / UC Course  ?I have reviewed the triage vital signs and the nursing notes. ? ?Pertinent labs & imaging results that were available during my care of the patient were reviewed by me and considered in my medical decision making (see chart for details). ? ?56 year old female presenting for cold sores and right-sided ear pain.  Patient does have obvious cold sores and canker sore.  Additionally she has cerumen impaction of bilateral ears and erythema and swelling of the right tragus.  Otic lavage performed on both ears.  After, normal appearance  of TMs but still has a lot of moisture in the ear canal. ? ?Sent Valtrex for her cold sores.  Sent in cefdinir for the mild cellulitis of the right tragus.  Also prescribed eardrops since patient had her ears flushed o

## 2021-06-11 NOTE — Discharge Instructions (Signed)
-  I sent Valtrex for the cold sores. ?- Clean the ears out.  Since it has been flush so heavily, petite risk for swimmer's ear sites and eardrops.  Use the next couple days to try to prevent it ?- You have cellulitis or soft tissue infection of your tragus so I have sent antibiotics for that as well. ?- Follow-up with [place] as needed especially if you are not improving. ?

## 2021-06-11 NOTE — ED Triage Notes (Signed)
Patient is here for "Ear pain" & "Cold Sore". Right ear pain and cold sore (bottom left of mouth). Started with ear pain "after lying on it". Mayne a "cold sore or two on tongue".  ?

## 2021-06-20 ENCOUNTER — Ambulatory Visit: Payer: Managed Care, Other (non HMO) | Admitting: Internal Medicine

## 2021-07-04 ENCOUNTER — Encounter: Payer: Self-pay | Admitting: Internal Medicine

## 2021-07-04 ENCOUNTER — Telehealth: Payer: Self-pay | Admitting: Internal Medicine

## 2021-07-04 NOTE — Telephone Encounter (Signed)
Left message to call the office to confirm appointment that was moved from 07/06/21 to 07/07/21 @ 11:00. ?

## 2021-07-05 ENCOUNTER — Encounter: Payer: Self-pay | Admitting: Internal Medicine

## 2021-07-05 ENCOUNTER — Telehealth (INDEPENDENT_AMBULATORY_CARE_PROVIDER_SITE_OTHER): Payer: Managed Care, Other (non HMO) | Admitting: Internal Medicine

## 2021-07-05 DIAGNOSIS — J0110 Acute frontal sinusitis, unspecified: Secondary | ICD-10-CM

## 2021-07-05 DIAGNOSIS — R051 Acute cough: Secondary | ICD-10-CM | POA: Diagnosis not present

## 2021-07-05 DIAGNOSIS — R0989 Other specified symptoms and signs involving the circulatory and respiratory systems: Secondary | ICD-10-CM | POA: Diagnosis not present

## 2021-07-05 DIAGNOSIS — J9801 Acute bronchospasm: Secondary | ICD-10-CM | POA: Diagnosis not present

## 2021-07-05 MED ORDER — ALBUTEROL SULFATE HFA 108 (90 BASE) MCG/ACT IN AERS: 1.0000 | INHALATION_SPRAY | Freq: Four times a day (QID) | RESPIRATORY_TRACT | 0 refills | Status: DC | PRN

## 2021-07-05 MED ORDER — HYDROCOD POLI-CHLORPHE POLI ER 10-8 MG/5ML PO SUER: 5.0000 mL | Freq: Every evening | ORAL | 0 refills | Status: DC | PRN

## 2021-07-05 MED ORDER — AZITHROMYCIN 250 MG PO TABS: ORAL_TABLET | ORAL | 0 refills | Status: AC

## 2021-07-05 NOTE — Progress Notes (Signed)
Virtual Visit via Video Note ? ?I connected with Dawn Lynn ? on 07/05/21 at 4:20 PM EDT by a video enabled telemedicine application and verified that I am speaking with the correct person using two identifiers. ? Location patient: [name] ?Location provider:work or home office ?Persons participating in the virtual visit: patient, provider ? ?I discussed the limitations and requested verbal permission for telemedicine visit. The patient expressed understanding and agreed to proceed. ? ? ?HPI: ? ?Acute telemedicine visit for : ?-sick visit since end of 05/2021 had cough dry now with phelgm and increased phelgm worse Sunday with chest congestion with phlelgm no fever or other sxs tried advil sinus/pain and pressure robitussin dm benadyl qhs with some relief has also tried nasal saline, nasacort which has helped. Allergra zyrtec in the past helped only 1-2 days but benadryl better she does have allergies to pollen and they did have flowers in the home from church recently no h/o smoking or vaping. Prior ct chest 06/2020 +bronchiectasis likely from prior infection. She also states notes itching and eyes are watery. They have moved flowers out of the house and will plant outside for now  ? ?Sx's started end of 05/2021 sore throat no sick contacts.  ? ?Denies wheezing ? ?-Pertinent past medical history: see below ?-Pertinent medication allergies:No Known Allergies ?-COVID-19 vaccine status:  ?Immunization History  ?Administered Date(s) Administered  ? Influenza,inj,Quad PF,6+ Mos 04/23/2015, 02/07/2019, 01/17/2021  ? Influenza-Unspecified 01/18/2016  ? Moderna Sars-Covid-2 Vaccination 08/11/2019, 09/15/2019  ? ? ? ?ROS: See pertinent positives and negatives per HPI. ? ?Past Medical History:  ?Diagnosis Date  ? GERD (gastroesophageal reflux disease)   ? Hypertension   ? Hypothyroidism   ? ? ?Past Surgical History:  ?Procedure Laterality Date  ? ABDOMINAL HYSTERECTOMY  2011  ? BREAST BIOPSY Left 12/26/2007  ? Dr Jamal Collin- neg  ?  CHOLECYSTECTOMY  03/2012  ? Dr Nicholes Stairs  ? UPPER GI ENDOSCOPY    ? upper Endoscopy (10-15-2010)   ? ? ? ?Current Outpatient Medications:  ?  albuterol (VENTOLIN HFA) 108 (90 Base) MCG/ACT inhaler, Inhale 1-2 puffs into the lungs every 6 (six) hours as needed for wheezing or shortness of breath., Disp: 18 g, Rfl: 0 ?  azithromycin (ZITHROMAX) 250 MG tablet, With food Take 2 tablets on day 1, then 1 tablet daily on days 2 through 5, Disp: 6 tablet, Rfl: 0 ?  chlorpheniramine-HYDROcodone (TUSSIONEX PENNKINETIC ER) 10-8 MG/5ML, Take 5 mLs by mouth at bedtime as needed., Disp: 115 mL, Rfl: 0 ?  diphenhydrAMINE HCl (BENADRYL ALLERGY PO), Take by mouth at bedtime as needed., Disp: , Rfl:  ?  esomeprazole (NEXIUM) 20 MG capsule, Take 20 mg by mouth daily at 12 noon., Disp: , Rfl:  ?  levothyroxine (SYNTHROID) 75 MCG tablet, TAKE 1 TABLET BY MOUTH DAILY BEFORE BREAKFAST., Disp: 90 tablet, Rfl: 1 ?  losartan (COZAAR) 25 MG tablet, TAKE 1 TABLET BY MOUTH EVERY DAY, Disp: 90 tablet, Rfl: 3 ?  Ascorbic Acid (VITAMIN C PO), Take by mouth. (Patient not taking: Reported on 07/05/2021), Disp: , Rfl:  ?  cyclobenzaprine (FLEXERIL) 10 MG tablet, Take 1 tablet (10 mg total) by mouth at bedtime. (Patient not taking: Reported on 07/05/2021), Disp: 30 tablet, Rfl: 0 ?  Multiple Vitamins-Minerals (MULTIVITAMIN WOMEN PO), Take by mouth. (Patient not taking: Reported on 07/05/2021), Disp: , Rfl:  ?  Omega-3 Fatty Acids (OMEGA-3 FISH OIL PO), Take by mouth. (Patient not taking: Reported on 07/05/2021), Disp: , Rfl:  ?  potassium chloride (KLOR-CON M10) 10 MEQ tablet, TAKE 1 TABLET BY MOUTH TWICE A DAY FOR 2 DAYS THEN TAKE 1 TABLET BY MOUTH EVERY DAY (Patient not taking: Reported on 07/05/2021), Disp: 90 tablet, Rfl: 1 ?  VITAMIN D PO, Take by mouth. (Patient not taking: Reported on 07/05/2021), Disp: , Rfl:  ? ?EXAM: ? ?VITALS per patient if applicable: ? ?GENERAL: alert, oriented, appears well and in no acute distress ? ?HEENT: atraumatic,  conjunttiva clear, no obvious abnormalities on inspection of external nose and ears ? ?NECK: normal movements of the head and neck ? ?LUNGS: on inspection no signs of respiratory distress, breathing rate appears normal, no obvious gross SOB, gasping or wheezing ?+mild cough on exam  ? ?CV: no obvious cyanosis ? ?MS: moves all visible extremities without noticeable abnormality ? ?PSYCH/NEURO: pleasant and cooperative, no obvious depression or anxiety, speech and thought processing grossly intact ? ?ASSESSMENT AND PLAN: ? ?Discussed the following assessment and plan: ? ?Acute frontal sinusitis, recurrence not specified - Plan: azithromycin (ZITHROMAX) 250 MG tablet, chlorpheniramine-HYDROcodone (TUSSIONEX PENNKINETIC ER) 10-8 MG/5ML, albuterol (VENTOLIN HFA) 108 (90 Base) MCG/ACT inhaler ?Ns, nasacort  ?Prn benadryl  ? ?Acute cough - Plan: chlorpheniramine-HYDROcodone (TUSSIONEX PENNKINETIC ER) 10-8 MG/5ML qhs prn ? ?Chest congestion - Plan: albuterol (VENTOLIN HFA) 108 (90 Base) MCG/ACT inhaler ? ?Bronchospasm - Plan: albuterol (VENTOLIN HFA) 108 (90 Base) MCG/ACT inhaler ? ?-we discussed possible serious and likely etiologies, options for evaluation and workup, limitations of telemedicine visit vs in person visit, treatment, treatment risks and precautions. Pt is agreeable to treatment via telemedicine at this moment.  ? ?  ?I discussed the assessment and treatment plan with the patient. The patient was provided an opportunity to ask questions and all were answered. The patient agreed with the plan and demonstrated an understanding of the instructions. ?  ? ?Time spent 20 minutes ?Nino Glow McLean-Scocuzza, MD   ?

## 2021-07-06 ENCOUNTER — Ambulatory Visit: Payer: Managed Care, Other (non HMO) | Admitting: Internal Medicine

## 2021-07-07 ENCOUNTER — Ambulatory Visit (INDEPENDENT_AMBULATORY_CARE_PROVIDER_SITE_OTHER): Payer: Managed Care, Other (non HMO) | Admitting: Internal Medicine

## 2021-07-07 DIAGNOSIS — E039 Hypothyroidism, unspecified: Secondary | ICD-10-CM

## 2021-07-07 DIAGNOSIS — K219 Gastro-esophageal reflux disease without esophagitis: Secondary | ICD-10-CM

## 2021-07-07 DIAGNOSIS — R059 Cough, unspecified: Secondary | ICD-10-CM | POA: Insufficient documentation

## 2021-07-07 DIAGNOSIS — R051 Acute cough: Secondary | ICD-10-CM | POA: Diagnosis not present

## 2021-07-07 DIAGNOSIS — I10 Essential (primary) hypertension: Secondary | ICD-10-CM | POA: Diagnosis not present

## 2021-07-07 DIAGNOSIS — M542 Cervicalgia: Secondary | ICD-10-CM

## 2021-07-07 DIAGNOSIS — E7800 Pure hypercholesterolemia, unspecified: Secondary | ICD-10-CM

## 2021-07-07 DIAGNOSIS — F439 Reaction to severe stress, unspecified: Secondary | ICD-10-CM

## 2021-07-07 MED ORDER — PREDNISONE 10 MG PO TABS
ORAL_TABLET | ORAL | 0 refills | Status: DC
Start: 2021-07-07 — End: 2021-08-21

## 2021-07-07 NOTE — Progress Notes (Signed)
[name], female   DOB: 19-Feb-1966, 56 y.o.   MRN: [id number] ? ? ?Subjective:  ? ? [name], female    DOB: [birth year], 56 y.o.   MRN: [id number] ? ?This visit occurred during the SARS-CoV-2 public health emergency.  Safety protocols were in place, including screening questions prior to the visit, additional usage of staff PPE, and extensive cleaning of exam room while observing appropriate contact time as indicated for disinfecting solutions.  ? ?Patient here for a scheduled follow up.  ? ?Chief Complaint  ?Patient presents with  ? Follow-up  ?  Follow up for sinusitis - Pt saw Dr. French Ana 4/11 for cough and congestion starting Monday 4/10  , was given Zpac, cough syrup and inhaler. Pt has started zPac, has not yet picked up the cough medicine, and insurance denied the inhaler. Pt c/o cough worsening.   ? .  ? ?HPI ?Was seen recently and diagnosed with sinus infection.  Treated with zpak.  Also given cough syrup and an inhaler.  Taking zpak.  Has not started either one of the other medications.  Reports end of 05/2021 - had dry cough.  Micah Flesher out of town recently.  Treated for cold sores and ear infection.  Better.  On 06/23/21 - sore throat - for about 5-6 days.  Increased nasal congestion and chest congestion.  Increased cough.  Using saline nasal spray and steroid nasal spray.  Taking benadryl and robitussin DM.  Still with persistent increased cough and coughing fits.  No fever.  No vomiting.  No sob or chest pain.  No abdominal pain.  Planning a trip to [place] - in May.  Neck ok.  ? ? ?Past Medical History:  ?Diagnosis Date  ? GERD (gastroesophageal reflux disease)   ? Hypertension   ? Hypothyroidism   ? ?Past Surgical History:  ?Procedure Laterality Date  ? ABDOMINAL HYSTERECTOMY  2011  ? BREAST BIOPSY Left 12/26/2007  ? Dr Evette Cristal- neg  ? CHOLECYSTECTOMY  03/2012  ? Dr Malissa Hippo  ? UPPER GI ENDOSCOPY    ? upper Endoscopy (10-15-2010)   ? ?Family History  ?Problem Relation  Age of Onset  ? Hypertension Mother   ? Breast cancer Mother 58  ?     negative genetic testing  ? Heart block Father   ? Breast cancer Maternal Uncle   ? Heart disease Maternal Grandmother   ? Stroke Maternal Grandmother   ? Hypertension Maternal Grandmother   ? Heart disease Maternal Grandfather   ? Stroke Maternal Grandfather   ? Hypertension Maternal Grandfather   ? Diabetes Paternal Grandfather   ? ?Social History  ? ?Socioeconomic History  ? Marital status: Married  ?  Spouse name: Jonny Ruiz  ? Number of children: 2  ? Years of education: Not on file  ? Highest education level: Not on file  ?Occupational History  ?  Employer: [employer]  ?Tobacco Use  ? Smoking status: Former  ? Smokeless tobacco: Never  ? Tobacco comments:  ?  smoked in college  ?Vaping Use  ? Vaping Use: Never used  ?Substance and Sexual Activity  ? Alcohol use: No  ?  Alcohol/week: 0.0 standard drinks  ? Drug use: No  ? Sexual activity: Not on file  ?Other Topics Concern  ? Not on file  ?Social History Narrative  ? Not on file  ? ?Social Determinants of Health  ? ?Financial Resource Strain: Not on file  ?Food Insecurity: Not on  file  ?Transportation Needs: Not on file  ?Physical Activity: Not on file  ?Stress: Not on file  ?Social Connections: Not on file  ? ? ? ?Review of Systems  ?Constitutional:  Negative for appetite change and unexpected weight change.  ?HENT:  Positive for congestion. Negative for sinus pressure.   ?Respiratory:  Positive for cough. Negative for chest tightness and shortness of breath.   ?Cardiovascular:  Negative for chest pain, palpitations and leg swelling.  ?Gastrointestinal:  Negative for abdominal pain, diarrhea, nausea and vomiting.  ?Genitourinary:  Negative for difficulty urinating and dysuria.  ?Musculoskeletal:  Negative for joint swelling and myalgias.  ?Skin:  Negative for color change and rash.  ?Neurological:  Negative for dizziness, light-headedness and headaches.  ?Psychiatric/Behavioral:  Negative for  agitation and dysphoric mood.   ? ?   ?Objective:  ?  ? ?BP 122/70 (BP Location: Left Arm, Patient Position: Sitting, Cuff Size: Small)   Pulse 63   Temp 97.9 ?F (36.6 ?C) (Temporal)   Resp 16   Ht 5\' 2"  (1.575 m)   Wt 144 lb (65.3 kg)   LMP 06/26/2009   SpO2 100%   BMI 26.34 kg/m?  ?Wt Readings from Last 3 Encounters:  ?07/07/21 144 lb (65.3 kg)  ?06/11/21 142 lb (64.4 kg)  ?01/17/21 142 lb 12.8 oz (64.8 kg)  ? ? ?Physical Exam ?Vitals reviewed.  ?Constitutional:   ?   General: She is not in acute distress. ?   Appearance: Normal appearance.  ?HENT:  ?   Head: Normocephalic and atraumatic.  ?   Right Ear: External ear normal.  ?   Left Ear: External ear normal.  ?Eyes:  ?   General: No scleral icterus.    ?   Right eye: No discharge.     ?   Left eye: No discharge.  ?   Conjunctiva/sclera: Conjunctivae normal.  ?Neck:  ?   Thyroid: No thyromegaly.  ?Cardiovascular:  ?   Rate and Rhythm: Normal rate and regular rhythm.  ?Pulmonary:  ?   Effort: No respiratory distress.  ?   Breath sounds: Normal breath sounds. No wheezing.  ?Abdominal:  ?   General: Bowel sounds are normal.  ?   Palpations: Abdomen is soft.  ?   Tenderness: There is no abdominal tenderness.  ?Musculoskeletal:     ?   General: No swelling or tenderness.  ?   Cervical back: Neck supple. No tenderness.  ?Lymphadenopathy:  ?   Cervical: No cervical adenopathy.  ?Skin: ?   Findings: No erythema or rash.  ?Neurological:  ?   Mental Status: She is alert.  ?Psychiatric:     ?   Mood and Affect: Mood normal.     ?   Behavior: Behavior normal.  ? ? ? ?Outpatient Encounter Medications as of 07/07/2021  ?Medication Sig  ? azithromycin (ZITHROMAX) 250 MG tablet With food Take 2 tablets on day 1, then 1 tablet daily on days 2 through 5  ? chlorpheniramine-HYDROcodone (TUSSIONEX PENNKINETIC ER) 10-8 MG/5ML Take 5 mLs by mouth at bedtime as needed.  ? diphenhydrAMINE HCl (BENADRYL ALLERGY PO) Take by mouth at bedtime as needed.  ? esomeprazole (NEXIUM) 20 MG  capsule Take 20 mg by mouth daily at 12 noon.  ? levothyroxine (SYNTHROID) 75 MCG tablet TAKE 1 TABLET BY MOUTH DAILY BEFORE BREAKFAST.  ? losartan (COZAAR) 25 MG tablet TAKE 1 TABLET BY MOUTH EVERY DAY  ? predniSONE (DELTASONE) 10 MG tablet Take 4 tablets x 1  day and then decrease by 1/2 tablet per day until down to zero mg.  ? [DISCONTINUED] albuterol (VENTOLIN HFA) 108 (90 Base) MCG/ACT inhaler Inhale 1-2 puffs into the lungs every 6 (six) hours as needed for wheezing or shortness of breath.  ? [DISCONTINUED] Ascorbic Acid (VITAMIN C PO) Take by mouth. (Patient not taking: Reported on 07/05/2021)  ? [DISCONTINUED] cyclobenzaprine (FLEXERIL) 10 MG tablet Take 1 tablet (10 mg total) by mouth at bedtime. (Patient not taking: Reported on 07/05/2021)  ? [DISCONTINUED] Multiple Vitamins-Minerals (MULTIVITAMIN WOMEN PO) Take by mouth. (Patient not taking: Reported on 07/05/2021)  ? [DISCONTINUED] Omega-3 Fatty Acids (OMEGA-3 FISH OIL PO) Take by mouth. (Patient not taking: Reported on 07/05/2021)  ? [DISCONTINUED] potassium chloride (KLOR-CON M10) 10 MEQ tablet TAKE 1 TABLET BY MOUTH TWICE A DAY FOR 2 DAYS THEN TAKE 1 TABLET BY MOUTH EVERY DAY (Patient not taking: Reported on 07/05/2021)  ? [DISCONTINUED] VITAMIN D PO Take by mouth. (Patient not taking: Reported on 07/05/2021)  ? ?No facility-administered encounter medications on file as of 07/07/2021.  ?  ? ?Lab Results  ?Component Value Date  ? WBC 7.2 01/17/2021  ? HGB 13.3 01/17/2021  ? HCT 39.3 01/17/2021  ? PLT 317.0 01/17/2021  ? GLUCOSE 98 01/17/2021  ? CHOL 300 (H) 01/17/2021  ? TRIG 373.0 (H) 01/17/2021  ? HDL 44.40 01/17/2021  ? LDLDIRECT 164.0 01/17/2021  ? LDLCALC 187 (H) 10/10/2019  ? ALT 26 01/17/2021  ? AST 23 01/17/2021  ? NA 139 01/17/2021  ? K 4.3 02/25/2021  ? CL 97 01/17/2021  ? CREATININE 0.80 01/17/2021  ? BUN 11 01/17/2021  ? CO2 33 (H) 01/17/2021  ? TSH 4.06 05/25/2020  ? ? ?   ?Assessment & Plan:  ? ?Problem List Items Addressed This Visit   ? ? Cough   ?  Persistent cough and congestion as outlined.  On zpak.  Continue saline nasal spray, steroid nasal spray and robitussin DM.  Prednisone taper as directed.  Follow.  Call with update.  Discussed covid te

## 2021-07-08 ENCOUNTER — Other Ambulatory Visit: Payer: Self-pay

## 2021-07-08 DIAGNOSIS — J9801 Acute bronchospasm: Secondary | ICD-10-CM

## 2021-07-08 DIAGNOSIS — J0110 Acute frontal sinusitis, unspecified: Secondary | ICD-10-CM

## 2021-07-08 LAB — NOVEL CORONAVIRUS, NAA: SARS-CoV-2, NAA: NOT DETECTED

## 2021-07-08 MED ORDER — ALBUTEROL SULFATE HFA 108 (90 BASE) MCG/ACT IN AERS: 1.0000 | INHALATION_SPRAY | Freq: Four times a day (QID) | RESPIRATORY_TRACT | 2 refills | Status: DC | PRN

## 2021-07-10 ENCOUNTER — Encounter: Payer: Self-pay | Admitting: Internal Medicine

## 2021-07-10 NOTE — Assessment & Plan Note (Signed)
No upper symptoms reported.  Nexium.  

## 2021-07-10 NOTE — Assessment & Plan Note (Signed)
Has been on losartan.  Follow pressures.  Schedule metabolic panel.  

## 2021-07-10 NOTE — Assessment & Plan Note (Signed)
Watching her diet. Check lipid panel.  Calcium score - 0 - on recent check.  Diet and exercise.  Will follow.  ?

## 2021-07-10 NOTE — Assessment & Plan Note (Signed)
Stable

## 2021-07-10 NOTE — Assessment & Plan Note (Signed)
Persistent cough and congestion as outlined.  On zpak.  Continue saline nasal spray, steroid nasal spray and robitussin DM.  Prednisone taper as directed.  Follow.  Call with update.  Discussed covid test.  She is agreeable.  ?

## 2021-07-10 NOTE — Assessment & Plan Note (Signed)
On thyroid replacement.  Follow tsh.  

## 2021-07-10 NOTE — Assessment & Plan Note (Signed)
Overall appears to be handling things well.  Follow.  ?

## 2021-07-14 ENCOUNTER — Other Ambulatory Visit: Payer: Self-pay | Admitting: Internal Medicine

## 2021-07-15 ENCOUNTER — Encounter: Payer: Self-pay | Admitting: Internal Medicine

## 2021-07-15 MED ORDER — AZITHROMYCIN 250 MG PO TABS: ORAL_TABLET | ORAL | 0 refills | Status: AC

## 2021-07-15 NOTE — Telephone Encounter (Signed)
Rx sent in for zpak.  ?

## 2021-08-21 ENCOUNTER — Ambulatory Visit
Admission: EM | Admit: 2021-08-21 | Discharge: 2021-08-21 | Disposition: A | Discharge status: Home / Self Care | Payer: Managed Care, Other (non HMO) | Attending: Emergency Medicine | Admitting: Emergency Medicine

## 2021-08-21 DIAGNOSIS — L237 Allergic contact dermatitis due to plants, except food: Secondary | ICD-10-CM | POA: Diagnosis not present

## 2021-08-21 MED ORDER — PREDNISONE 10 MG (21) PO TBPK: ORAL_TABLET | Freq: Every day | ORAL | 0 refills | Status: DC

## 2021-08-21 NOTE — ED Triage Notes (Signed)
Pt was working in the yard last Saturday and Monday and was kneeling in her garden.  Pt states on Wednesday and Thursday her lower legs began itching.  Pt states that it felt like her legs were on fire. Pt began to use the Cortizone 10 ointment.  Pt is having Redness, welts, and scabbing along her legs. Pt denies scratching hard enough to pull up skin.

## 2021-08-21 NOTE — ED Provider Notes (Signed)
[facility]    CSN: [id number] Arrival date & time: 08/21/21  1441      History   Chief Complaint Chief Complaint  Patient presents with   Rash    HPI Dawn Lynn is a 56 y.o. female.   Patient presents with erythematous, pruritic rash present to the bilateral upper and lower legs for 5 days.  Endorses symptoms started after working in her garden, initially thought she had just scratched her legs.  Has poison ivy/oak in the yard.  Rash is causing a burning sensation.  Has attempted use of oral and topical Benadryl as well as cortisone with no improvement.   Past Medical History:  Diagnosis Date   GERD (gastroesophageal reflux disease)    Hypertension    Hypothyroidism     Patient Active Problem List   Diagnosis Date Noted   Cough 07/07/2021   Colon cancer screening 05/03/2020   Essential hypertension 07/09/2016   Neck pain 03/05/2016   Abnormal liver enzymes 02/29/2016   Trapezius strain 01/21/2016   Family history of early CAD 11/29/2015   Travel advice encounter 04/25/2015   Breast cyst 10/17/2014   Sleep disturbance 09/24/2014   Head pain 09/24/2014   Stress 09/24/2014   Health care maintenance 09/24/2014   Hypercholesterolemia 09/03/2013   Hypothyroidism 09/04/2012   GERD (gastroesophageal reflux disease) 06/26/2012   Chronic chest pain 06/26/2012    Past Surgical History:  Procedure Laterality Date   ABDOMINAL HYSTERECTOMY  2011   BREAST BIOPSY Left 12/26/2007   Dr Evette Cristal- neg   CHOLECYSTECTOMY  03/2012   Dr Malissa Hippo   UPPER GI ENDOSCOPY     upper Endoscopy (10-15-2010)     OB History     Gravida  2   Para  2   Term      Preterm      AB      Living         SAB      IAB      Ectopic      Multiple      Live Births               Home Medications    Prior to Admission medications   Medication Sig Start Date End Date Taking? Authorizing Provider  diphenhydrAMINE HCl (BENADRYL ALLERGY PO) Take by mouth  at bedtime as needed.   Yes [provider]  esomeprazole (NEXIUM) 20 MG capsule Take 20 mg by mouth daily at 12 noon.   Yes [provider]  levothyroxine (SYNTHROID) 75 MCG tablet TAKE 1 TABLET BY MOUTH EVERY DAY BEFORE BREAKFAST 07/15/21  Yes Dale Montague, MD  losartan (COZAAR) 25 MG tablet TAKE 1 TABLET BY MOUTH EVERY DAY 12/31/20  Yes Dale Black Hawk, MD  albuterol (VENTOLIN HFA) 108 (90 Base) MCG/ACT inhaler Inhale 1-2 puffs into the lungs every 6 (six) hours as needed for wheezing or shortness of breath. 07/08/21   McLean-Scocuzza, Pasty Spillers, MD  chlorpheniramine-HYDROcodone (TUSSIONEX PENNKINETIC ER) 10-8 MG/5ML Take 5 mLs by mouth at bedtime as needed. 07/05/21   McLean-Scocuzza, Pasty Spillers, MD  predniSONE (DELTASONE) 10 MG tablet Take 4 tablets x 1 day and then decrease by 1/2 tablet per day until down to zero mg. 07/07/21   Dale Onamia, MD    Family History Family History  Problem Relation Age of Onset   Hypertension Mother    Breast cancer Mother 64       negative genetic testing   Heart block  Father    Breast cancer Maternal Uncle    Heart disease Maternal Grandmother    Stroke Maternal Grandmother    Hypertension Maternal Grandmother    Heart disease Maternal Grandfather    Stroke Maternal Grandfather    Hypertension Maternal Grandfather    Diabetes Paternal Grandfather     Social History Social History   Tobacco Use   Smoking status: Former   Smokeless tobacco: Never   Tobacco comments:    smoked in college  Vaping Use   Vaping Use: Never used  Substance Use Topics   Alcohol use: No    Alcohol/week: 0.0 standard drinks   Drug use: No     Allergies   Patient has no known allergies.   Review of Systems Review of Systems  Constitutional: Negative.   Respiratory: Negative.    Cardiovascular: Negative.   Skin:  Positive for rash. Negative for color change, pallor and wound.  Neurological: Negative.     Physical Exam Triage Vital  Signs ED Triage Vitals  Enc Vitals Group     BP 08/21/21 1456 132/65     Pulse Rate 08/21/21 1456 69     Resp 08/21/21 1456 18     Temp 08/21/21 1456 98.3 F (36.8 C)     Temp Source 08/21/21 1456 Oral     SpO2 08/21/21 1456 94 %     Weight 08/21/21 1454 144 lb (65.3 kg)     Height 08/21/21 1454 5\' 2"  (1.575 m)     Head Circumference --      Peak Flow --      Pain Score 08/21/21 1454 8     Pain Loc --      Pain Edu? --      Excl. in GC? --    No data found.  Updated Vital Signs BP 132/65 (BP Location: Left Arm)   Pulse 69   Temp 98.3 F (36.8 C) (Oral)   Resp 18   Ht 5\' 2"  (1.575 m)   Wt 144 lb (65.3 kg)   LMP 06/26/2009   SpO2 94%   BMI 26.34 kg/m   Visual Acuity Right Eye Distance:   Left Eye Distance:   Bilateral Distance:    Right Eye Near:   Left Eye Near:    Bilateral Near:     Physical Exam Constitutional:      Appearance: Normal appearance.  HENT:     Head: Normocephalic.  Eyes:     Extraocular Movements: Extraocular movements intact.  Pulmonary:     Effort: Pulmonary effort is normal.  Skin:    Comments: Erythematous papular rash with blistering present to the bilateral lower extremities  Neurological:     Mental Status: She is alert and oriented to person, place, and time. Mental status is at baseline.  Psychiatric:        Mood and Affect: Mood normal.        Behavior: Behavior normal.     UC Treatments / Results  Labs (all labs ordered are listed, but only abnormal results are displayed) Labs Reviewed - No data to display  EKG   Radiology No results found.  Procedures Procedures (including critical care time)  Medications Ordered in UC Medications - No data to display  Initial Impression / Assessment and Plan / UC Course  I have reviewed the triage vital signs and the nursing notes.  Pertinent labs & imaging results that were available during my care of the patient were reviewed by me  and considered in my medical decision  making (see chart for details).  Poison ivy dermatitis  Rashes consistent with presentation, discussed with patient, prednisone 60 mg taper prescribed, declined in office injection, may continue use of oral and topical antihistamines as well as calamine lotion for management of pruritus, advised patient to not scratch the area in efforts to reduce risk for secondary infection, given strict precautions to follow-up for persistent or worsening rash past use of medication Final Clinical Impressions(s) / UC Diagnoses   Final diagnoses:  None   Discharge Instructions   None    ED Prescriptions   None    PDMP not reviewed this encounter.   Valinda Hoar, NP 08/21/21 1529

## 2021-08-21 NOTE — Discharge Instructions (Signed)
Your rash presentation is consistent with a poison ivy/oak dermatitis  Starting tomorrow take prednisone course every morning with food as directed on packaging, this medicine will stop the inflammatory process associated with this rash and slowly clear it  You may continue use of oral or topical Benadryl, may also use Zyrtec or Claritin if not wanting to be drowsy, may also use calamine lotion as needed  Attempt to not scratch the area as any breaks in the skin will increase your risk for additional infection  You may follow-up with urgent care as needed if symptoms continue to persist or worsen even with use of medication

## 2021-08-22 ENCOUNTER — Other Ambulatory Visit: Payer: Self-pay | Admitting: Internal Medicine

## 2021-08-23 ENCOUNTER — Encounter: Payer: Self-pay | Admitting: Internal Medicine

## 2021-08-23 ENCOUNTER — Telehealth (INDEPENDENT_AMBULATORY_CARE_PROVIDER_SITE_OTHER): Payer: Managed Care, Other (non HMO) | Admitting: Internal Medicine

## 2021-08-23 DIAGNOSIS — I10 Essential (primary) hypertension: Secondary | ICD-10-CM | POA: Diagnosis not present

## 2021-08-23 DIAGNOSIS — L237 Allergic contact dermatitis due to plants, except food: Secondary | ICD-10-CM

## 2021-08-23 MED ORDER — TRIAMCINOLONE ACETONIDE 0.1 % EX CREA: 1.0000 "application " | TOPICAL_CREAM | Freq: Two times a day (BID) | CUTANEOUS | 0 refills | Status: AC

## 2021-08-23 MED ORDER — HYDROXYZINE HCL 10 MG PO TABS: 10.0000 mg | ORAL_TABLET | Freq: Three times a day (TID) | ORAL | 0 refills | Status: AC | PRN

## 2021-08-23 NOTE — Telephone Encounter (Signed)
Pt agreeable to virtual at 430 - scheduled

## 2021-08-23 NOTE — Telephone Encounter (Signed)
See if she is agreeable for work in appt to discuss treatment - to see what other options to help with itching.  Does she want to do a virtual this pm - or need me to work her in tomorrow.

## 2021-08-23 NOTE — Progress Notes (Signed)
[name], female   DOB: 1966-03-14, 56 y.o.   MRN: [id number]   Virtual Visit via video Note  All issues noted in this document were discussed and addressed.  No physical exam was performed (except for noted visual exam findings with Video Visits).   I connected with Dawn Lynn by a video enabled telemedicine application and verified that I am speaking with the correct person using two identifiers. Location patient: home Location provider: work Persons participating in the virtual visit: patient, provider  The limitations, risks, security and privacy concerns of performing an evaluation and management service by video and the availability of in person appointments have been discussed.  It has also been discussed with the patient that there may be a patient responsible charge related to this service. The patient expressed understanding and agreed to proceed.   Reason for visit: work in appt  HPI: Work in to discuss persistent itching - poison ivy dermatitis.  Was seen at urgent care 08/21/21 - rash - bilateral upper and lower legs.  Noticed after working in her yard. Was diagnosed with poison ivy dermatitis.  Was prescribed prednisone and instructed to use oral and topical antihistamines.  She has also been using calamine lotion, which is helping to dry some of the areas.  Did not take prednisone. Topical cortisone - she felt aggravated.  Still with increased itching.  Using ice packs.  Needs something more to help with itching.    ROS: See pertinent positives and negatives per HPI.  Past Medical History:  Diagnosis Date   GERD (gastroesophageal reflux disease)    Hypertension    Hypothyroidism     Past Surgical History:  Procedure Laterality Date   ABDOMINAL HYSTERECTOMY  2011   BREAST BIOPSY Left 12/26/2007   Dr Jamal Collin- neg   CHOLECYSTECTOMY  03/2012   Dr Nicholes Stairs   UPPER GI ENDOSCOPY     upper Endoscopy (10-15-2010)     Family History  Problem Relation  Age of Onset   Hypertension Mother    Breast cancer Mother 63       negative genetic testing   Heart block Father    Breast cancer Maternal Uncle    Heart disease Maternal Grandmother    Stroke Maternal Grandmother    Hypertension Maternal Grandmother    Heart disease Maternal Grandfather    Stroke Maternal Grandfather    Hypertension Maternal Grandfather    Diabetes Paternal Grandfather     SOCIAL HX: reviewed.    Current Outpatient Medications:    esomeprazole (NEXIUM) 20 MG capsule, Take 20 mg by mouth daily at 12 noon., Disp: , Rfl:    hydrOXYzine (ATARAX) 10 MG tablet, Take 1 tablet (10 mg total) by mouth 3 (three) times daily as needed., Disp: 30 tablet, Rfl: 0   levothyroxine (SYNTHROID) 75 MCG tablet, TAKE 1 TABLET BY MOUTH EVERY DAY BEFORE BREAKFAST, Disp: 90 tablet, Rfl: 1   losartan (COZAAR) 25 MG tablet, TAKE 1 TABLET BY MOUTH EVERY DAY, Disp: 90 tablet, Rfl: 3   triamcinolone cream (KENALOG) 0.1 %, Apply 1 application. topically 2 (two) times daily., Disp: 30 g, Rfl: 0  EXAM:  GENERAL: alert, oriented, appears well and in no acute distress  HEENT: atraumatic, conjunttiva clear, no obvious abnormalities on inspection of external nose and ears  NECK: normal movements of the head and neck  LUNGS: on inspection no signs of respiratory distress, breathing rate appears normal, no obvious gross SOB, gasping or wheezing  CV:  no obvious cyanosis  MS: some swelling - bilateral legs.  SKIN: erythematous/linear and patches - rash - scaling.    PSYCH/NEURO: pleasant and cooperative, no obvious depression or anxiety, speech and thought processing grossly intact  ASSESSMENT AND PLAN:  Discussed the following assessment and plan:  Problem List Items Addressed This Visit     Essential hypertension    Has been on losartan.  Follow pressures.        Poison ivy dermatitis    Desires not to take steroids/prednisone.  Take zyrtec in am and if needed po benadryl in the  evening.  Trial of topical TCC cream.  Hydroxyzine to help with itching.  Follow closely.  Call with update.         Return if symptoms worsen or fail to improve.   I discussed the assessment and treatment plan with the patient. The patient was provided an opportunity to ask questions and all were answered. The patient agreed with the plan and demonstrated an understanding of the instructions.   The patient was advised to call back or seek an in-person evaluation if the symptoms worsen or if the condition fails to improve as anticipated.    Einar Pheasant, MD

## 2021-08-24 ENCOUNTER — Encounter: Payer: Self-pay | Admitting: Internal Medicine

## 2021-08-25 ENCOUNTER — Encounter: Payer: Self-pay | Admitting: Internal Medicine

## 2021-08-25 NOTE — Telephone Encounter (Signed)
See other note.  Getting better.

## 2021-08-28 ENCOUNTER — Encounter: Payer: Self-pay | Admitting: Internal Medicine

## 2021-08-28 DIAGNOSIS — L237 Allergic contact dermatitis due to plants, except food: Secondary | ICD-10-CM | POA: Insufficient documentation

## 2021-08-28 NOTE — Assessment & Plan Note (Signed)
Has been on losartan.  Follow pressures.

## 2021-08-28 NOTE — Assessment & Plan Note (Signed)
Desires not to take steroids/prednisone.  Take zyrtec in am and if needed po benadryl in the evening.  Trial of topical TCC cream.  Hydroxyzine to help with itching.  Follow closely.  Call with update.

## 2021-09-05 ENCOUNTER — Other Ambulatory Visit: Payer: Self-pay | Admitting: Internal Medicine

## 2021-09-14 ENCOUNTER — Encounter: Payer: Self-pay | Admitting: Internal Medicine

## 2021-10-06 ENCOUNTER — Ambulatory Visit: Payer: Managed Care, Other (non HMO) | Admitting: Internal Medicine

## 2022-01-15 ENCOUNTER — Other Ambulatory Visit: Payer: Self-pay | Admitting: Internal Medicine

## 2022-04-06 ENCOUNTER — Encounter: Payer: Self-pay | Admitting: Internal Medicine

## 2022-04-06 ENCOUNTER — Ambulatory Visit: Payer: Managed Care, Other (non HMO) | Admitting: Internal Medicine

## 2022-04-06 VITALS — BP 120/82 | HR 69 | Temp 97.8°F | Resp 15 | Ht 62.0 in | Wt 147.0 lb

## 2022-04-06 DIAGNOSIS — E039 Hypothyroidism, unspecified: Secondary | ICD-10-CM

## 2022-04-06 DIAGNOSIS — I10 Essential (primary) hypertension: Secondary | ICD-10-CM

## 2022-04-06 DIAGNOSIS — Z8249 Family history of ischemic heart disease and other diseases of the circulatory system: Secondary | ICD-10-CM

## 2022-04-06 DIAGNOSIS — E7800 Pure hypercholesterolemia, unspecified: Secondary | ICD-10-CM

## 2022-04-06 DIAGNOSIS — Z0000 Encounter for general adult medical examination without abnormal findings: Secondary | ICD-10-CM | POA: Diagnosis not present

## 2022-04-06 DIAGNOSIS — Z1211 Encounter for screening for malignant neoplasm of colon: Secondary | ICD-10-CM

## 2022-04-06 DIAGNOSIS — Z1231 Encounter for screening mammogram for malignant neoplasm of breast: Secondary | ICD-10-CM

## 2022-04-06 DIAGNOSIS — Z23 Encounter for immunization: Secondary | ICD-10-CM | POA: Diagnosis not present

## 2022-04-06 DIAGNOSIS — K219 Gastro-esophageal reflux disease without esophagitis: Secondary | ICD-10-CM

## 2022-04-06 DIAGNOSIS — F439 Reaction to severe stress, unspecified: Secondary | ICD-10-CM

## 2022-04-06 LAB — CBC WITH DIFFERENTIAL/PLATELET
Basophils Absolute: 0.1 10*3/uL (ref 0.0–0.1)
Basophils Relative: 0.8 % (ref 0.0–3.0)
Eosinophils Absolute: 0.1 10*3/uL (ref 0.0–0.7)
Eosinophils Relative: 1.5 % (ref 0.0–5.0)
HCT: 37.6 % (ref 36.0–46.0)
Hemoglobin: 12.9 g/dL (ref 12.0–15.0)
Lymphocytes Relative: 30.7 % (ref 12.0–46.0)
Lymphs Abs: 2.4 10*3/uL (ref 0.7–4.0)
MCHC: 34.3 g/dL (ref 30.0–36.0)
MCV: 91.2 fl (ref 78.0–100.0)
Monocytes Absolute: 0.5 10*3/uL (ref 0.1–1.0)
Monocytes Relative: 6 % (ref 3.0–12.0)
Neutro Abs: 4.7 10*3/uL (ref 1.4–7.7)
Neutrophils Relative %: 61 % (ref 43.0–77.0)
Platelets: 386 10*3/uL (ref 150.0–400.0)
RBC: 4.12 Mil/uL (ref 3.87–5.11)
RDW: 14.1 % (ref 11.5–15.5)
WBC: 7.7 10*3/uL (ref 4.0–10.5)

## 2022-04-06 LAB — LDL CHOLESTEROL, DIRECT: Direct LDL: 204 mg/dL

## 2022-04-06 LAB — COMPREHENSIVE METABOLIC PANEL
ALT: 32 U/L (ref 0–35)
AST: 20 U/L (ref 0–37)
Albumin: 4.6 g/dL (ref 3.5–5.2)
Alkaline Phosphatase: 73 U/L (ref 39–117)
BUN: 11 mg/dL (ref 6–23)
CO2: 27 mEq/L (ref 19–32)
Calcium: 9.8 mg/dL (ref 8.4–10.5)
Chloride: 106 mEq/L (ref 96–112)
Creatinine, Ser: 0.87 mg/dL (ref 0.40–1.20)
GFR: 75.34 mL/min (ref >60.00)
Glucose, Bld: 85 mg/dL (ref 70–99)
Potassium: 4.5 mEq/L (ref 3.5–5.1)
Sodium: 141 mEq/L (ref 135–145)
Total Bilirubin: 0.4 mg/dL (ref 0.2–1.2)
Total Protein: 7 g/dL (ref 6.0–8.3)

## 2022-04-06 LAB — LIPID PANEL
Cholesterol: 324 mg/dL — ABNORMAL HIGH (ref 0–200)
HDL: 45.9 mg/dL (ref >39.00)
NonHDL: 278.16
Total CHOL/HDL Ratio: 7
Triglycerides: 265 mg/dL — ABNORMAL HIGH (ref 0.0–149.0)
VLDL: 53 mg/dL — ABNORMAL HIGH (ref 0.0–40.0)

## 2022-04-06 LAB — TSH: TSH: 7.55 u[IU]/mL — ABNORMAL HIGH (ref 0.35–5.50)

## 2022-04-06 MED ORDER — LOSARTAN POTASSIUM 50 MG PO TABS: 50.0000 mg | ORAL_TABLET | Freq: Every day | ORAL | 1 refills | Status: DC

## 2022-04-06 MED ORDER — LEVOTHYROXINE SODIUM 75 MCG PO TABS: ORAL_TABLET | ORAL | 1 refills | Status: DC

## 2022-04-06 NOTE — Progress Notes (Signed)
Subjective:    [name], female    DOB: 01/07/1966, 57 y.o.   MRN: [id number]  Patient here for  Chief Complaint  Patient presents with   Annual Exam    CPE    HPI Here for physical.  Increased stress - work.  Discussed.  Discussed counseling and medication.  She has good support.  She does not feel she needs any further intervention at this time.  When she feels more stressed, she goes out walking.  No chest pain or shortness of breath reported.  No increased cough or congestion. Previous CT calcium score zero.  Blood pressure has been averaging around the 130s/low to mid 80s.  On losartan 25 mg/day.  Discussed adjusting dose.  She was scheduled for a colonoscopy.  This had to be rescheduled.  She plans to call to get this rescheduled.  No bowel issues reported.   Past Medical History:  Diagnosis Date   GERD (gastroesophageal reflux disease)    Hypertension    Hypothyroidism    Past Surgical History:  Procedure Laterality Date   ABDOMINAL HYSTERECTOMY  2011   BREAST BIOPSY Left 12/26/2007   Dr Jamal Collin- neg   CHOLECYSTECTOMY  03/2012   Dr Nicholes Stairs   UPPER GI ENDOSCOPY     upper Endoscopy (10-15-2010)    Family History  Problem Relation Age of Onset   Hypertension Mother    Breast cancer Mother 37       negative genetic testing   Heart block Father    Breast cancer Maternal Uncle    Heart disease Maternal Grandmother    Stroke Maternal Grandmother    Hypertension Maternal Grandmother    Heart disease Maternal Grandfather    Stroke Maternal Grandfather    Hypertension Maternal Grandfather    Diabetes Paternal Grandfather    Social History   Socioeconomic History   Marital status: Married    Spouse name: John   Number of children: 2   Years of education: Not on file   Highest education level: Not on file  Occupational History    Employer: [employer]  Tobacco Use   Smoking status: Former   Smokeless tobacco: Never   Tobacco comments:     smoked in Charity fundraiser Use: Never used  Substance and Sexual Activity   Alcohol use: No    Alcohol/week: 0.0 standard drinks of alcohol   Drug use: No   Sexual activity: Not on file  Other Topics Concern   Not on file  Social History Narrative   Not on file   Social Determinants of Health   Financial Resource Strain: Not on file  Food Insecurity: Not on file  Transportation Needs: Not on file  Physical Activity: Not on file  Stress: Not on file  Social Connections: Not on file     Review of Systems  Constitutional:  Negative for appetite change and unexpected weight change.  HENT:  Negative for congestion, sinus pressure and sore throat.   Eyes:  Negative for pain and visual disturbance.  Respiratory:  Negative for cough, chest tightness and shortness of breath.   Cardiovascular:  Negative for chest pain, palpitations and leg swelling.  Gastrointestinal:  Negative for abdominal pain, constipation and diarrhea.  Genitourinary:  Negative for difficulty urinating and dysuria.  Musculoskeletal:  Negative for back pain and joint swelling.  Skin:  Negative for color change and rash.  Neurological:  Negative for dizziness, light-headedness and headaches.  Hematological:  Negative for adenopathy.  Psychiatric/Behavioral:  Negative for agitation and dysphoric mood.        Increased stress as outlined        Objective:     BP 120/82 (BP Location: Left Arm, Patient Position: Sitting, Cuff Size: Small)   Pulse 69   Temp 97.8 F (36.6 C) (Temporal)   Resp 15   Ht 5\' 2"  (1.575 m)   Wt 147 lb (66.7 kg)   LMP 06/26/2009   SpO2 100%   BMI 26.89 kg/m  Wt Readings from Last 3 Encounters:  04/06/22 147 lb (66.7 kg)  08/23/21 144 lb (65.3 kg)  08/21/21 144 lb (65.3 kg)    Physical Exam Vitals reviewed.  Constitutional:      General: She is not in acute distress.    Appearance: Normal appearance. She is well-developed.  HENT:     Head: Normocephalic and  atraumatic.     Right Ear: External ear normal.     Left Ear: External ear normal.  Eyes:     General: No scleral icterus.       Right eye: No discharge.        Left eye: No discharge.     Conjunctiva/sclera: Conjunctivae normal.  Neck:     Thyroid: No thyromegaly.  Cardiovascular:     Rate and Rhythm: Normal rate and regular rhythm.  Pulmonary:     Effort: No tachypnea, accessory muscle usage or respiratory distress.     Breath sounds: Normal breath sounds. No decreased breath sounds or wheezing.  Chest:  Breasts:    Right: No inverted nipple, mass, nipple discharge or tenderness (no axillary adenopathy).     Left: No inverted nipple, mass, nipple discharge or tenderness (no axilarry adenopathy).  Abdominal:     General: Bowel sounds are normal.     Palpations: Abdomen is soft.     Tenderness: There is no abdominal tenderness.  Musculoskeletal:        General: No swelling or tenderness.     Cervical back: Neck supple.  Lymphadenopathy:     Cervical: No cervical adenopathy.  Skin:    Findings: No erythema or rash.  Neurological:     Mental Status: She is alert and oriented to person, place, and time.  Psychiatric:        Mood and Affect: Mood normal.        Behavior: Behavior normal.      Outpatient Encounter Medications as of 04/06/2022  Medication Sig   esomeprazole (NEXIUM) 20 MG capsule Take 20 mg by mouth daily at 12 noon.   hydrOXYzine (ATARAX) 10 MG tablet Take 1 tablet (10 mg total) by mouth 3 (three) times daily as needed.   losartan (COZAAR) 50 MG tablet Take 1 tablet (50 mg total) by mouth daily.   triamcinolone cream (KENALOG) 0.1 % Apply 1 application. topically 2 (two) times daily.   [DISCONTINUED] levothyroxine (SYNTHROID) 75 MCG tablet TAKE 1 TABLET BY MOUTH EVERY DAY BEFORE BREAKFAST   [DISCONTINUED] losartan (COZAAR) 25 MG tablet TAKE 1 TABLET BY MOUTH EVERY DAY   levothyroxine (SYNTHROID) 75 MCG tablet TAKE 1 TABLET BY MOUTH EVERY DAY BEFORE BREAKFAST    No facility-administered encounter medications on file as of 04/06/2022.     Lab Results  Component Value Date   WBC 7.7 04/06/2022   HGB 12.9 04/06/2022   HCT 37.6 04/06/2022   PLT 386.0 04/06/2022   GLUCOSE 85 04/06/2022   CHOL 324 (H) 04/06/2022  TRIG 265.0 (H) 04/06/2022   HDL 45.90 04/06/2022   LDLDIRECT 204.0 04/06/2022   LDLCALC 187 (H) 10/10/2019   ALT 32 04/06/2022   AST 20 04/06/2022   NA 141 04/06/2022   K 4.5 04/06/2022   CL 106 04/06/2022   CREATININE 0.87 04/06/2022   BUN 11 04/06/2022   CO2 27 04/06/2022   TSH 7.55 (H) 04/06/2022    No results found.     Assessment & Plan:  Routine general medical examination at a health care facility  Health care maintenance Assessment & Plan: Physical today 04/06/22.  PAP 01/17/21 - negative with negative HPV. Mammogram 10/08/20 - Birads I.  Schedule f/u mammogram.  She will reschedule colonoscopy.     Hypercholesterolemia Assessment & Plan: Check lipid panel.  Calcium score - 0 - on recent check.  Diet and exercise.  Will follow.   Orders: -     CBC with Differential/Platelet -     Comprehensive metabolic panel -     Lipid panel  Hypothyroidism, unspecified type Assessment & Plan: On thyroid replacement.  Follow tsh.    Orders: -     TSH  Essential hypertension Assessment & Plan: On losartan 25mg   q day.  Discussed blood pressures.  Will increase to 50mg  q day.  Follow pressures.  Check metabolic panel.   Orders: -     Comprehensive metabolic panel  Encounter for screening mammogram for malignant neoplasm of breast -     3D Screening Mammogram, Left and Right; Future  Need for influenza vaccination -     Flu Vaccine QUAD 22mo+IM (Fluarix, Fluzone & Alfiuria Quad PF)  Colon cancer screening Assessment & Plan: Discussed with her today.  She had to reschedule previously scheduled colonoscopy.  Plans to call to reschedule.  Will notify me if problems.    Family history of early CAD Assessment &  Plan: Saw cardiology.  Calcium score 0.  Follow.    Gastroesophageal reflux disease, unspecified whether esophagitis present Assessment & Plan: No upper symptoms reported.  Nexium.    Stress Assessment & Plan: Increased stress.  Work stress.  Discussed.  Does not feel needs any further intervention. Follow.    Other orders -     Levothyroxine Sodium; TAKE 1 TABLET BY MOUTH EVERY DAY BEFORE BREAKFAST  Dispense: 90 tablet; Refill: 1 -     Losartan Potassium; Take 1 tablet (50 mg total) by mouth daily.  Dispense: 90 tablet; Refill: 1 -     LDL cholesterol, direct     , MD

## 2022-04-06 NOTE — Patient Instructions (Signed)
YOUR MAMMOGRAM IS DUE, PLEASE CALL AND GET THIS SCHEDULED!  A mammogram has been ordered for you. Please call the below facility to schedule ::  [facility] - call [phone]    

## 2022-04-06 NOTE — Assessment & Plan Note (Signed)
Physical today 04/06/22.  PAP 01/17/21 - negative with negative HPV. Mammogram 10/08/20 - Birads I.  Schedule f/u mammogram.  She will reschedule colonoscopy.

## 2022-04-07 ENCOUNTER — Encounter: Payer: Self-pay | Admitting: Internal Medicine

## 2022-04-07 NOTE — Assessment & Plan Note (Signed)
Saw cardiology.  Calcium score 0.  Follow.

## 2022-04-07 NOTE — Assessment & Plan Note (Signed)
Check lipid panel.  Calcium score - 0 - on recent check.  Diet and exercise.  Will follow.

## 2022-04-07 NOTE — Assessment & Plan Note (Signed)
Discussed with her today.  She had to reschedule previously scheduled colonoscopy.  Plans to call to reschedule.  Will notify me if problems.

## 2022-04-07 NOTE — Assessment & Plan Note (Signed)
On thyroid replacement.  Follow tsh.

## 2022-04-07 NOTE — Assessment & Plan Note (Signed)
No upper symptoms reported.  Nexium.  

## 2022-04-07 NOTE — Assessment & Plan Note (Signed)
On losartan 25mg   q day.  Discussed blood pressures.  Will increase to 50mg  q day.  Follow pressures.  Check metabolic panel.

## 2022-04-07 NOTE — Assessment & Plan Note (Signed)
Increased stress.  Work stress.  Discussed.  Does not feel needs any further intervention. Follow.

## 2022-04-12 ENCOUNTER — Other Ambulatory Visit: Payer: Self-pay

## 2022-04-12 MED ORDER — LEVOTHYROXINE SODIUM 88 MCG PO TABS: 88.0000 ug | ORAL_TABLET | Freq: Every day | ORAL | 3 refills | Status: DC

## 2022-04-12 MED ORDER — ROSUVASTATIN CALCIUM 5 MG PO TABS: 5.0000 mg | ORAL_TABLET | Freq: Every day | ORAL | 3 refills | Status: DC

## 2022-04-20 ENCOUNTER — Telehealth: Payer: Self-pay | Admitting: Internal Medicine

## 2022-04-20 DIAGNOSIS — E7800 Pure hypercholesterolemia, unspecified: Secondary | ICD-10-CM

## 2022-04-20 NOTE — Addendum Note (Signed)
Addended by: Roetta Sessions D on: 04/20/2022 03:49 PM   Modules accepted: Orders

## 2022-04-20 NOTE — Telephone Encounter (Signed)
Patient has a lab appt 04/26/2022, there are No orders in.

## 2022-04-20 NOTE — Telephone Encounter (Signed)
Liver function ordered 

## 2022-04-26 ENCOUNTER — Other Ambulatory Visit (INDEPENDENT_AMBULATORY_CARE_PROVIDER_SITE_OTHER): Payer: Managed Care, Other (non HMO)

## 2022-04-26 DIAGNOSIS — E7800 Pure hypercholesterolemia, unspecified: Secondary | ICD-10-CM | POA: Diagnosis not present

## 2022-04-26 LAB — HEPATIC FUNCTION PANEL
ALT: 17 U/L (ref 0–35)
AST: 15 U/L (ref 0–37)
Albumin: 4.6 g/dL (ref 3.5–5.2)
Alkaline Phosphatase: 62 U/L (ref 39–117)
Bilirubin, Direct: 0 mg/dL (ref 0.0–0.3)
Total Bilirubin: 0.4 mg/dL (ref 0.2–1.2)
Total Protein: 6.9 g/dL (ref 6.0–8.3)

## 2022-04-27 ENCOUNTER — Other Ambulatory Visit: Payer: Self-pay

## 2022-04-27 ENCOUNTER — Telehealth: Payer: Self-pay

## 2022-04-27 DIAGNOSIS — E039 Hypothyroidism, unspecified: Secondary | ICD-10-CM

## 2022-04-27 DIAGNOSIS — E7800 Pure hypercholesterolemia, unspecified: Secondary | ICD-10-CM

## 2022-04-27 NOTE — Telephone Encounter (Signed)
  LM FOR PT TO CB ORDERS PLACED  Einar Pheasant, MD  [facility] - liver panel wnl.  Per last lab result note, was to have a f/u tsh checked in 6-8 weeks.  Lab appt is scheduled.  I would like to also check her liver panel with tsh.  Please place order for f/u tsh and liver panel.  Looks like lab appt is already scheduled. Please confirm pt aware.

## 2022-04-27 NOTE — Telephone Encounter (Signed)
Pt called back. Call was transferred. 

## 2022-05-03 ENCOUNTER — Encounter: Payer: Self-pay | Admitting: Internal Medicine

## 2022-05-03 ENCOUNTER — Ambulatory Visit
Admission: RE | Admit: 2022-05-03 | Discharge: 2022-05-03 | Disposition: A | Discharge status: Home / Self Care | Payer: Managed Care, Other (non HMO) | Source: Ambulatory Visit | Attending: Internal Medicine | Admitting: Internal Medicine

## 2022-05-03 DIAGNOSIS — Z1231 Encounter for screening mammogram for malignant neoplasm of breast: Secondary | ICD-10-CM | POA: Diagnosis not present

## 2022-05-03 DIAGNOSIS — R9389 Abnormal findings on diagnostic imaging of other specified body structures: Secondary | ICD-10-CM | POA: Insufficient documentation

## 2022-05-31 ENCOUNTER — Other Ambulatory Visit: Payer: Managed Care, Other (non HMO)

## 2022-06-02 ENCOUNTER — Other Ambulatory Visit (INDEPENDENT_AMBULATORY_CARE_PROVIDER_SITE_OTHER): Payer: Managed Care, Other (non HMO)

## 2022-06-02 DIAGNOSIS — E039 Hypothyroidism, unspecified: Secondary | ICD-10-CM

## 2022-06-02 LAB — TSH: TSH: 1.31 u[IU]/mL (ref 0.35–5.50)

## 2022-06-04 ENCOUNTER — Encounter: Payer: Self-pay | Admitting: Internal Medicine

## 2022-07-06 ENCOUNTER — Ambulatory Visit: Payer: Managed Care, Other (non HMO) | Admitting: Internal Medicine

## 2022-07-13 ENCOUNTER — Ambulatory Visit: Payer: Managed Care, Other (non HMO) | Admitting: Internal Medicine

## 2022-08-25 ENCOUNTER — Ambulatory Visit (INDEPENDENT_AMBULATORY_CARE_PROVIDER_SITE_OTHER): Payer: Managed Care, Other (non HMO) | Admitting: Internal Medicine

## 2022-08-25 VITALS — BP 120/70 | HR 85 | Temp 97.9°F | Resp 16 | Ht 62.0 in | Wt 149.0 lb

## 2022-08-25 DIAGNOSIS — E7800 Pure hypercholesterolemia, unspecified: Secondary | ICD-10-CM

## 2022-08-25 DIAGNOSIS — Z1211 Encounter for screening for malignant neoplasm of colon: Secondary | ICD-10-CM

## 2022-08-25 DIAGNOSIS — E039 Hypothyroidism, unspecified: Secondary | ICD-10-CM

## 2022-08-25 DIAGNOSIS — Z713 Dietary counseling and surveillance: Secondary | ICD-10-CM

## 2022-08-25 DIAGNOSIS — R748 Abnormal levels of other serum enzymes: Secondary | ICD-10-CM

## 2022-08-25 DIAGNOSIS — F439 Reaction to severe stress, unspecified: Secondary | ICD-10-CM

## 2022-08-25 DIAGNOSIS — I10 Essential (primary) hypertension: Secondary | ICD-10-CM | POA: Diagnosis not present

## 2022-08-25 DIAGNOSIS — M542 Cervicalgia: Secondary | ICD-10-CM

## 2022-08-25 DIAGNOSIS — K219 Gastro-esophageal reflux disease without esophagitis: Secondary | ICD-10-CM

## 2022-08-25 DIAGNOSIS — Z8249 Family history of ischemic heart disease and other diseases of the circulatory system: Secondary | ICD-10-CM

## 2022-08-25 LAB — LIPID PANEL
Cholesterol: 188 mg/dL (ref 0–200)
HDL: 39.7 mg/dL (ref >39.00)
NonHDL: 148.01
Total CHOL/HDL Ratio: 5
Triglycerides: 267 mg/dL — ABNORMAL HIGH (ref 0.0–149.0)
VLDL: 53.4 mg/dL — ABNORMAL HIGH (ref 0.0–40.0)

## 2022-08-25 LAB — HEPATIC FUNCTION PANEL
ALT: 23 U/L (ref 0–35)
AST: 22 U/L (ref 0–37)
Albumin: 4.4 g/dL (ref 3.5–5.2)
Alkaline Phosphatase: 55 U/L (ref 39–117)
Bilirubin, Direct: 0 mg/dL (ref 0.0–0.3)
Total Bilirubin: 0.3 mg/dL (ref 0.2–1.2)
Total Protein: 7.1 g/dL (ref 6.0–8.3)

## 2022-08-25 LAB — BASIC METABOLIC PANEL
BUN: 13 mg/dL (ref 6–23)
CO2: 24 mEq/L (ref 19–32)
Calcium: 9.6 mg/dL (ref 8.4–10.5)
Chloride: 108 mEq/L (ref 96–112)
Creatinine, Ser: 0.91 mg/dL (ref 0.40–1.20)
GFR: 71.19 mL/min (ref >60.00)
Glucose, Bld: 102 mg/dL — ABNORMAL HIGH (ref 70–99)
Potassium: 4.1 mEq/L (ref 3.5–5.1)
Sodium: 140 mEq/L (ref 135–145)

## 2022-08-25 LAB — TSH: TSH: 2.43 u[IU]/mL (ref 0.35–5.50)

## 2022-08-25 LAB — LDL CHOLESTEROL, DIRECT: Direct LDL: 107 mg/dL

## 2022-08-25 MED ORDER — LOSARTAN POTASSIUM 50 MG PO TABS: 50.0000 mg | ORAL_TABLET | Freq: Every day | ORAL | 1 refills | Status: DC

## 2022-08-25 NOTE — Progress Notes (Unsigned)
Subjective:    [name], female    DOB: Dec 13, 1965, 57 y.o.   MRN: [id number]  Patient here for  Chief Complaint  Patient presents with   Medical Management of Chronic Issues    HPI Here for a scheduled follow up regarding her blood pressure.  Also f/u regarding increased stress and hypercholesterolemia.  Previous calcium score 0. Recent LDL >200.  Started on low dose crestor.  She is walking. Watching what she eats.  Not losing weight.  Interested in referral to weight management program.  No chest pain or sob reported.  No abdominal pain.  Bowels moving.  Blood pressures averaging 120s/70s.  Discussed colonoscopy.  Agreeable for referral.  Neck doing better with water pillow.    Past Medical History:  Diagnosis Date   GERD (gastroesophageal reflux disease)    Hypertension    Hypothyroidism    Past Surgical History:  Procedure Laterality Date   ABDOMINAL HYSTERECTOMY  2011   BREAST BIOPSY Left 12/26/2007   Dr Evette Cristal- neg   CHOLECYSTECTOMY  03/2012   Dr Malissa Hippo   UPPER GI ENDOSCOPY     upper Endoscopy (10-15-2010)    Family History  Problem Relation Age of Onset   Hypertension Mother    Breast cancer Mother 29       negative genetic testing   Heart block Father    Breast cancer Maternal Uncle    Heart disease Maternal Grandmother    Stroke Maternal Grandmother    Hypertension Maternal Grandmother    Heart disease Maternal Grandfather    Stroke Maternal Grandfather    Hypertension Maternal Grandfather    Diabetes Paternal Grandfather    Social History   Socioeconomic History   Marital status: Married    Spouse name: John   Number of children: 2   Years of education: Not on file   Highest education level: Associate degree: occupational, Scientist, product/process development, or vocational program  Occupational History    Employer: [employer]  Tobacco Use   Smoking status: Former   Smokeless tobacco: Never   Tobacco comments:    smoked in Water engineer Use: Never used  Substance and Sexual Activity   Alcohol use: No    Alcohol/week: 0.0 standard drinks of alcohol   Drug use: No   Sexual activity: Not on file  Other Topics Concern   Not on file  Social History Narrative   Not on file   Social Determinants of Health   Financial Resource Strain: Low Risk  (08/25/2022)   Overall Financial Resource Strain (CARDIA)    Difficulty of Paying Living Expenses: Not very hard  Food Insecurity: No Food Insecurity (08/25/2022)   Hunger Vital Sign    Worried About Running Out of Food in the Last Year: Never true    Ran Out of Food in the Last Year: Never true  Transportation Needs: No Transportation Needs (08/25/2022)   PRAPARE - Administrator, Civil Service (Medical): No    Lack of Transportation (Non-Medical): No  Physical Activity: Sufficiently Active (08/25/2022)   Exercise Vital Sign    Days of Exercise per Week: 5 days    Minutes of Exercise per Session: 40 min  Stress: Stress Concern Present (08/25/2022)   [employer] of Occupational Health - Occupational Stress Questionnaire    Feeling of Stress : Rather much  Social Connections: Socially Integrated (08/25/2022)   Social Connection and Isolation Panel [NHANES]  Frequency of Communication with Friends and Family: More than three times a week    Frequency of Social Gatherings with Friends and Family: More than three times a week    Attends Religious Services: More than 4 times per year    Active Member of [employer] or Organizations: Yes    Attends Engineer, structural: More than 4 times per year    Marital Status: Married     Review of Systems  Constitutional:  Negative for appetite change and unexpected weight change.  HENT:  Negative for congestion and sinus pressure.   Respiratory:  Negative for cough, chest tightness and shortness of breath.   Cardiovascular:  Negative for chest pain, palpitations and leg swelling.  Gastrointestinal:  Negative  for abdominal pain, diarrhea, nausea and vomiting.  Genitourinary:  Negative for difficulty urinating and dysuria.  Musculoskeletal:  Negative for joint swelling and myalgias.  Skin:  Negative for color change and rash.  Neurological:  Negative for dizziness and headaches.  Psychiatric/Behavioral:  Negative for agitation and dysphoric mood.        Objective:     BP 120/70   Pulse 85   Temp 97.9 F (36.6 C)   Resp 16   Ht 5\' 2"  (1.575 m)   Wt 149 lb (67.6 kg)   LMP 06/26/2009   SpO2 98%   BMI 27.25 kg/m  Wt Readings from Last 3 Encounters:  08/25/22 149 lb (67.6 kg)  04/06/22 147 lb (66.7 kg)  08/23/21 144 lb (65.3 kg)    Physical Exam Vitals reviewed.  Constitutional:      General: She is not in acute distress.    Appearance: Normal appearance.  HENT:     Head: Normocephalic and atraumatic.     Right Ear: External ear normal.     Left Ear: External ear normal.  Eyes:     General: No scleral icterus.       Right eye: No discharge.        Left eye: No discharge.     Conjunctiva/sclera: Conjunctivae normal.  Neck:     Thyroid: No thyromegaly.  Cardiovascular:     Rate and Rhythm: Normal rate and regular rhythm.  Pulmonary:     Effort: No respiratory distress.     Breath sounds: Normal breath sounds. No wheezing.  Abdominal:     General: Bowel sounds are normal.     Palpations: Abdomen is soft.     Tenderness: There is no abdominal tenderness.  Musculoskeletal:        General: No swelling or tenderness.     Cervical back: Neck supple. No tenderness.  Lymphadenopathy:     Cervical: No cervical adenopathy.  Skin:    Findings: No erythema or rash.  Neurological:     Mental Status: She is alert.  Psychiatric:        Mood and Affect: Mood normal.        Behavior: Behavior normal.      Outpatient Encounter Medications as of 08/25/2022  Medication Sig   esomeprazole (NEXIUM) 20 MG capsule Take 20 mg by mouth daily at 12 noon.   hydrOXYzine (ATARAX) 10 MG  tablet Take 1 tablet (10 mg total) by mouth 3 (three) times daily as needed.   levothyroxine (SYNTHROID) 88 MCG tablet Take 1 tablet (88 mcg total) by mouth daily.   losartan (COZAAR) 50 MG tablet Take 1 tablet (50 mg total) by mouth daily.   rosuvastatin (CRESTOR) 5 MG tablet Take 1 tablet (  5 mg total) by mouth daily.   triamcinolone cream (KENALOG) 0.1 % Apply 1 application. topically 2 (two) times daily.   No facility-administered encounter medications on file as of 08/25/2022.     Lab Results  Component Value Date   WBC 7.7 04/06/2022   HGB 12.9 04/06/2022   HCT 37.6 04/06/2022   PLT 386.0 04/06/2022   GLUCOSE 85 04/06/2022   CHOL 324 (H) 04/06/2022   TRIG 265.0 (H) 04/06/2022   HDL 45.90 04/06/2022   LDLDIRECT 204.0 04/06/2022   LDLCALC 187 (H) 10/10/2019   ALT 17 04/26/2022   AST 15 04/26/2022   NA 141 04/06/2022   K 4.5 04/06/2022   CL 106 04/06/2022   CREATININE 0.87 04/06/2022   BUN 11 04/06/2022   CO2 27 04/06/2022   TSH 1.31 06/02/2022    MM 3D SCREEN BREAST BILATERAL  Result Date: 05/04/2022 CLINICAL DATA:  Screening. EXAM: DIGITAL SCREENING BILATERAL MAMMOGRAM WITH TOMOSYNTHESIS AND CAD TECHNIQUE: Bilateral screening digital craniocaudal and mediolateral oblique mammograms were obtained. Bilateral screening digital breast tomosynthesis was performed. The images were evaluated with computer-aided detection. COMPARISON:  Previous exam(s). ACR Breast Density Category b: There are scattered areas of fibroglandular density. FINDINGS: There are no findings suspicious for malignancy. IMPRESSION: No mammographic evidence of malignancy. A result letter of this screening mammogram will be mailed directly to the patient. RECOMMENDATION: Screening mammogram in one year. (Code:SM-B-01Y) BI-RADS CATEGORY  1: Negative. Electronically Signed   By: Harmon Pier M.D.   On: 05/04/2022 16:53       Assessment & Plan:  Essential hypertension  Hypercholesterolemia  Hypothyroidism,  unspecified type     Dale Moreland, MD

## 2022-08-26 ENCOUNTER — Encounter: Payer: Self-pay | Admitting: Internal Medicine

## 2022-08-26 DIAGNOSIS — Z713 Dietary counseling and surveillance: Secondary | ICD-10-CM | POA: Insufficient documentation

## 2022-08-26 NOTE — Assessment & Plan Note (Signed)
Increased stress.  Work stress.  Changed positions. Discussed.  Does not feel needs any further intervention. Follow.

## 2022-08-26 NOTE — Assessment & Plan Note (Signed)
No upper symptoms reported.  Nexium.  ?

## 2022-08-26 NOTE — Assessment & Plan Note (Signed)
Saw cardiology.  Calcium score 0.  Follow.  

## 2022-08-26 NOTE — Assessment & Plan Note (Signed)
Had elevation previously after starting cholesterol medication.  Low cholesterol diet.  Is walking.  Given cholesterol levels, she was started on low dose cholesterol medication.  Recheck liver panel today.

## 2022-08-26 NOTE — Assessment & Plan Note (Signed)
Neck is doing better with water pillow.

## 2022-08-26 NOTE — Assessment & Plan Note (Signed)
Check lipid panel.  Previous calcium score - 0 - on recent check.  LDL >200.  Was started on low dose crestor.  Low cholesterol diet and exercise.  Follow lipid panel and liver function tests.

## 2022-08-26 NOTE — Assessment & Plan Note (Signed)
Discussed with her today.  Agreeable for referral.

## 2022-08-26 NOTE — Assessment & Plan Note (Signed)
On thyroid replacement.  Follow tsh.  

## 2022-08-26 NOTE — Assessment & Plan Note (Signed)
Discussed.  She is concerned that she is not losing weight despite exercising and adjusting diet.  Discussed diet and exercise.  Will refer to weight management program.

## 2022-08-26 NOTE — Assessment & Plan Note (Signed)
On losartan 50mg  q day.  Blood pressures doing well. Follow pressures.  Check metabolic panel.

## 2022-08-28 ENCOUNTER — Telehealth: Payer: Self-pay

## 2022-08-28 NOTE — Telephone Encounter (Signed)
[facility] lipid diet mailed. 

## 2022-08-28 NOTE — Telephone Encounter (Signed)
-----   Message from Dale [place], MD sent at 08/26/2022 10:04 AM EDT ----- Notify - cholesterol has improved.  Bad cholesterol now 107 (down from 204).  I would like to continue her current dose of cholesterol medication.  Liver panel is wnl.  Her triglycerides remain elevated.  Send [facility] Lipid diet.  Continue low carb diet and exercise.  We will follow. Thyroid test, kidney function tests and liver function tests are wnl.

## 2022-08-28 NOTE — Telephone Encounter (Signed)
Patient called and note was read. She will continue taking her cholesterol medication and yes, she would like a copy of the [facility] Lipid diet.

## 2023-04-02 ENCOUNTER — Ambulatory Visit: Payer: Managed Care, Other (non HMO)

## 2023-04-02 DIAGNOSIS — K6389 Other specified diseases of intestine: Secondary | ICD-10-CM | POA: Diagnosis not present

## 2023-04-02 DIAGNOSIS — K5730 Diverticulosis of large intestine without perforation or abscess without bleeding: Secondary | ICD-10-CM | POA: Diagnosis not present

## 2023-04-02 DIAGNOSIS — Z1211 Encounter for screening for malignant neoplasm of colon: Secondary | ICD-10-CM | POA: Diagnosis present

## 2023-04-08 ENCOUNTER — Other Ambulatory Visit: Payer: Self-pay | Admitting: Internal Medicine

## 2023-04-25 ENCOUNTER — Encounter (INDEPENDENT_AMBULATORY_CARE_PROVIDER_SITE_OTHER): Payer: Self-pay

## 2023-08-28 ENCOUNTER — Encounter: Admitting: Internal Medicine

## 2023-10-04 ENCOUNTER — Other Ambulatory Visit: Payer: Self-pay | Admitting: Internal Medicine

## 2023-10-04 DIAGNOSIS — E7800 Pure hypercholesterolemia, unspecified: Secondary | ICD-10-CM

## 2023-10-04 DIAGNOSIS — E039 Hypothyroidism, unspecified: Secondary | ICD-10-CM

## 2023-10-04 NOTE — Telephone Encounter (Signed)
 Rx ok'd for levothyroxine , losartan  and crestor . She has not had labs since 07/2022. Please schedule for fasting labs. Labs ordered. Please schedule appt and she needs to keep her scheduled appt.

## 2023-10-05 NOTE — Telephone Encounter (Signed)
 Please schedule her for a fasting lab appointment

## 2023-11-01 ENCOUNTER — Other Ambulatory Visit (INDEPENDENT_AMBULATORY_CARE_PROVIDER_SITE_OTHER)

## 2023-11-01 DIAGNOSIS — E7800 Pure hypercholesterolemia, unspecified: Secondary | ICD-10-CM | POA: Diagnosis not present

## 2023-11-01 DIAGNOSIS — E039 Hypothyroidism, unspecified: Secondary | ICD-10-CM | POA: Diagnosis not present

## 2023-11-01 LAB — CBC WITH DIFFERENTIAL/PLATELET
Basophils Absolute: 0.1 K/uL (ref 0.0–0.1)
Basophils Relative: 0.7 % (ref 0.0–3.0)
Eosinophils Absolute: 0.2 K/uL (ref 0.0–0.7)
Eosinophils Relative: 2.7 % (ref 0.0–5.0)
HCT: 36.1 % (ref 36.0–46.0)
Hemoglobin: 12.2 g/dL (ref 12.0–15.0)
Lymphocytes Relative: 33.5 % (ref 12.0–46.0)
Lymphs Abs: 2.3 K/uL (ref 0.7–4.0)
MCHC: 33.7 g/dL (ref 30.0–36.0)
MCV: 90 fl (ref 78.0–100.0)
Monocytes Absolute: 0.5 K/uL (ref 0.1–1.0)
Monocytes Relative: 7.7 % (ref 3.0–12.0)
Neutro Abs: 3.8 K/uL (ref 1.4–7.7)
Neutrophils Relative %: 55.4 % (ref 43.0–77.0)
Platelets: 278 K/uL (ref 150.0–400.0)
RBC: 4.01 Mil/uL (ref 3.87–5.11)
RDW: 14.2 % (ref 11.5–15.5)
WBC: 6.9 K/uL (ref 4.0–10.5)

## 2023-11-01 LAB — BASIC METABOLIC PANEL WITH GFR
BUN: 9 mg/dL (ref 6–23)
CO2: 23 meq/L (ref 19–32)
Calcium: 9.6 mg/dL (ref 8.4–10.5)
Chloride: 108 meq/L (ref 96–112)
Creatinine, Ser: 0.73 mg/dL (ref 0.40–1.20)
GFR: 91.97 mL/min (ref >60.00)
Glucose, Bld: 110 mg/dL — ABNORMAL HIGH (ref 70–99)
Potassium: 4 meq/L (ref 3.5–5.1)
Sodium: 142 meq/L (ref 135–145)

## 2023-11-01 LAB — HEPATIC FUNCTION PANEL
ALT: 53 U/L — ABNORMAL HIGH (ref 0–35)
AST: 43 U/L — ABNORMAL HIGH (ref 0–37)
Albumin: 4.3 g/dL (ref 3.5–5.2)
Alkaline Phosphatase: 70 U/L (ref 39–117)
Bilirubin, Direct: 0 mg/dL (ref 0.0–0.3)
Total Bilirubin: 0.2 mg/dL (ref 0.2–1.2)
Total Protein: 6.7 g/dL (ref 6.0–8.3)

## 2023-11-01 LAB — LIPID PANEL
Cholesterol: 186 mg/dL (ref 0–200)
HDL: 42 mg/dL (ref >39.00)
LDL Cholesterol: 102 mg/dL — ABNORMAL HIGH (ref 0–99)
NonHDL: 144.16
Total CHOL/HDL Ratio: 4
Triglycerides: 213 mg/dL — ABNORMAL HIGH (ref 0.0–149.0)
VLDL: 42.6 mg/dL — ABNORMAL HIGH (ref 0.0–40.0)

## 2023-11-01 LAB — TSH: TSH: 3.14 u[IU]/mL (ref 0.35–5.50)

## 2023-11-02 ENCOUNTER — Ambulatory Visit: Payer: Self-pay | Admitting: Internal Medicine

## 2023-11-02 NOTE — Telephone Encounter (Signed)
 Copied from CRM [id number]. Topic: Clinical - Lab/Test Results >> Nov 02, 2023 11:08 AM Viola F wrote: Reason for CRM: Patient returned [facility] call, relayed lab results to patient and made her aware that they are also available on MyChart portal - patient verbally understood and had no further questions.

## 2023-11-06 ENCOUNTER — Other Ambulatory Visit

## 2023-11-12 ENCOUNTER — Encounter: Payer: Self-pay | Admitting: Internal Medicine

## 2023-11-12 ENCOUNTER — Ambulatory Visit (INDEPENDENT_AMBULATORY_CARE_PROVIDER_SITE_OTHER): Admitting: Internal Medicine

## 2023-11-12 ENCOUNTER — Other Ambulatory Visit (HOSPITAL_COMMUNITY)
Admission: RE | Admit: 2023-11-12 | Discharge: 2023-11-12 | Disposition: A | Discharge status: Home / Self Care | Source: Ambulatory Visit | Attending: Internal Medicine | Admitting: Internal Medicine

## 2023-11-12 VITALS — BP 120/70 | HR 80 | Resp 16 | Ht 62.0 in | Wt 139.2 lb

## 2023-11-12 DIAGNOSIS — Z124 Encounter for screening for malignant neoplasm of cervix: Secondary | ICD-10-CM

## 2023-11-12 DIAGNOSIS — Z1231 Encounter for screening mammogram for malignant neoplasm of breast: Secondary | ICD-10-CM | POA: Diagnosis not present

## 2023-11-12 DIAGNOSIS — R748 Abnormal levels of other serum enzymes: Secondary | ICD-10-CM

## 2023-11-12 DIAGNOSIS — F439 Reaction to severe stress, unspecified: Secondary | ICD-10-CM

## 2023-11-12 DIAGNOSIS — E7800 Pure hypercholesterolemia, unspecified: Secondary | ICD-10-CM

## 2023-11-12 DIAGNOSIS — E039 Hypothyroidism, unspecified: Secondary | ICD-10-CM

## 2023-11-12 DIAGNOSIS — K219 Gastro-esophageal reflux disease without esophagitis: Secondary | ICD-10-CM

## 2023-11-12 DIAGNOSIS — Z0000 Encounter for general adult medical examination without abnormal findings: Secondary | ICD-10-CM | POA: Diagnosis not present

## 2023-11-12 DIAGNOSIS — I10 Essential (primary) hypertension: Secondary | ICD-10-CM

## 2023-11-12 MED ORDER — SERTRALINE HCL 50 MG PO TABS
ORAL_TABLET | ORAL | 2 refills | Status: DC
Start: 2023-11-12 — End: 2023-12-05

## 2023-11-12 NOTE — Assessment & Plan Note (Addendum)
 Physical today 11/12/23. SABRA  PAP 01/17/21 - negative with negative HPV. Repeat pap today. Mammogram 05/03/22 - Birads I.  Schedule f/u mammogram. Overdue.  Colonoscopy 04/03/23 - diverticulosis, melanosis.

## 2023-11-12 NOTE — Progress Notes (Signed)
 Subjective:    [name], female    DOB: July 19, 1965, 58 y.o.   MRN: [id number]  Patient here for  Chief Complaint  Patient presents with   Annual Exam    HPI Here for a physical exam. Previous calcium  score 0. LDL > 200. Was started on crestor . Continues on losartan . Tries to stay active. No chest pain or sob reported. No abdominal pain or bowel change reported. Increased stress. Discussed. Feels needs something to help level things out. Discussed treatment options. Discussed labs - elevated liver function tests.    Past Medical History:  Diagnosis Date   GERD (gastroesophageal reflux disease)    Hypertension    Hypothyroidism    Past Surgical History:  Procedure Laterality Date   ABDOMINAL HYSTERECTOMY  2011   BREAST BIOPSY Left 12/26/2007   Dr Dellie- neg   CHOLECYSTECTOMY  03/2012   Dr LELON Sharps   UPPER GI ENDOSCOPY     upper Endoscopy (10-15-2010)    Family History  Problem Relation Age of Onset   Hypertension Mother    Breast cancer Mother 38       negative genetic testing   Heart block Father    Breast cancer Maternal Uncle    Heart disease Maternal Grandmother    Stroke Maternal Grandmother    Hypertension Maternal Grandmother    Heart disease Maternal Grandfather    Stroke Maternal Grandfather    Hypertension Maternal Grandfather    Diabetes Paternal Grandfather    Social History   Socioeconomic History   Marital status: Married    Spouse name: John   Number of children: 2   Years of education: Not on file   Highest education level: Associate degree: occupational, Scientist, product/process development, or vocational program  Occupational History    Employer: [employer]  Tobacco Use   Smoking status: Former   Smokeless tobacco: Never   Tobacco comments:    smoked in [employer]   Vaping status: Never Used  Substance and Sexual Activity   Alcohol use: No    Alcohol/week: 0.0 standard drinks of alcohol   Drug use: No   Sexual activity: Not  on file  Other Topics Concern   Not on file  Social History Narrative   Not on file   Social Drivers of Health   Financial Resource Strain: Low Risk  (08/25/2022)   Overall Financial Resource Strain (CARDIA)    Difficulty of Paying Living Expenses: Not very hard  Food Insecurity: No Food Insecurity (08/25/2022)   Hunger Vital Sign    Worried About Running Out of Food in the Last Year: Never true    Ran Out of Food in the Last Year: Never true  Transportation Needs: No Transportation Needs (08/25/2022)   PRAPARE - Administrator, Civil Service (Medical): No    Lack of Transportation (Non-Medical): No  Physical Activity: Sufficiently Active (08/25/2022)   Exercise Vital Sign    Days of Exercise per Week: 5 days    Minutes of Exercise per Session: 40 min  Stress: Stress Concern Present (08/25/2022)   [employer] of Occupational Health - Occupational Stress Questionnaire    Feeling of Stress : Rather much  Social Connections: Socially Integrated (08/25/2022)   Social Connection and Isolation Panel    Frequency of Communication with Friends and Family: More than three times a week    Frequency of Social Gatherings with Friends and Family: More than three times a week  Attends Religious Services: More than 4 times per year    Active Member of Clubs or Organizations: Yes    Attends Banker Meetings: More than 4 times per year    Marital Status: Married     Review of Systems  Constitutional:  Negative for appetite change and unexpected weight change.  HENT:  Negative for congestion, sinus pressure and sore throat.   Eyes:  Negative for pain and visual disturbance.  Respiratory:  Negative for cough, chest tightness and shortness of breath.   Cardiovascular:  Negative for chest pain, palpitations and leg swelling.  Gastrointestinal:  Negative for abdominal pain, diarrhea, nausea and vomiting.  Genitourinary:  Negative for difficulty urinating and dysuria.   Musculoskeletal:  Negative for joint swelling and myalgias.  Skin:  Negative for color change and rash.  Neurological:  Negative for dizziness and headaches.  Hematological:  Negative for adenopathy. Does not bruise/bleed easily.  Psychiatric/Behavioral:  Negative for agitation and dysphoric mood.        Objective:     BP 120/70   Pulse 80   Resp 16   Ht 5' 2 (1.575 m)   Wt 139 lb 3.2 oz (63.1 kg)   LMP 06/26/2009   SpO2 98%   BMI 25.46 kg/m  Wt Readings from Last 3 Encounters:  11/12/23 139 lb 3.2 oz (63.1 kg)  08/25/22 149 lb (67.6 kg)  04/06/22 147 lb (66.7 kg)    Physical Exam Vitals reviewed.  Constitutional:      General: She is not in acute distress.    Appearance: Normal appearance. She is well-developed.  HENT:     Head: Normocephalic and atraumatic.     Right Ear: External ear normal.     Left Ear: External ear normal.     Mouth/Throat:     Pharynx: No oropharyngeal exudate or posterior oropharyngeal erythema.  Eyes:     General: No scleral icterus.       Right eye: No discharge.        Left eye: No discharge.     Conjunctiva/sclera: Conjunctivae normal.  Neck:     Thyroid : No thyromegaly.  Cardiovascular:     Rate and Rhythm: Normal rate and regular rhythm.  Pulmonary:     Effort: No tachypnea, accessory muscle usage or respiratory distress.     Breath sounds: Normal breath sounds. No decreased breath sounds or wheezing.  Chest:  Breasts:    Right: No inverted nipple, mass, nipple discharge or tenderness (no axillary adenopathy).     Left: No inverted nipple, mass, nipple discharge or tenderness (no axilarry adenopathy).  Abdominal:     General: Bowel sounds are normal.     Palpations: Abdomen is soft.     Tenderness: There is no abdominal tenderness.  Musculoskeletal:        General: No swelling or tenderness.     Cervical back: Neck supple.  Lymphadenopathy:     Cervical: No cervical adenopathy.  Skin:    Findings: No erythema or rash.   Neurological:     Mental Status: She is alert and oriented to person, place, and time.  Psychiatric:        Mood and Affect: Mood normal.        Behavior: Behavior normal.         Outpatient Encounter Medications as of 11/12/2023  Medication Sig   esomeprazole (NEXIUM) 20 MG capsule Take 20 mg by mouth daily at 12 noon.   hydrOXYzine  (ATARAX ) 10 MG  tablet Take 1 tablet (10 mg total) by mouth 3 (three) times daily as needed.   levothyroxine  (SYNTHROID ) 88 MCG tablet TAKE 1 TABLET BY MOUTH EVERY DAY   losartan  (COZAAR ) 50 MG tablet TAKE 1 TABLET BY MOUTH EVERY DAY   rosuvastatin  (CRESTOR ) 5 MG tablet TAKE 1 TABLET (5 MG TOTAL) BY MOUTH DAILY.   sertraline  (ZOLOFT ) 50 MG tablet Take 1/2 tablet q day x 1 week and then one po q day.   triamcinolone  cream (KENALOG ) 0.1 % Apply 1 application. topically 2 (two) times daily.   No facility-administered encounter medications on file as of 11/12/2023.     Lab Results  Component Value Date   WBC 6.9 11/01/2023   HGB 12.2 11/01/2023   HCT 36.1 11/01/2023   PLT 278.0 11/01/2023   GLUCOSE 110 (H) 11/01/2023   CHOL 186 11/01/2023   TRIG 213.0 (H) 11/01/2023   HDL 42.00 11/01/2023   LDLDIRECT 107.0 08/25/2022   LDLCALC 102 (H) 11/01/2023   ALT 24 11/12/2023   AST 22 11/12/2023   NA 142 11/01/2023   K 4.0 11/01/2023   CL 108 11/01/2023   CREATININE 0.73 11/01/2023   BUN 9 11/01/2023   CO2 23 11/01/2023   TSH 3.14 11/01/2023    MM 3D SCREEN BREAST BILATERAL Result Date: 05/04/2022 CLINICAL DATA:  Screening. EXAM: DIGITAL SCREENING BILATERAL MAMMOGRAM WITH TOMOSYNTHESIS AND CAD TECHNIQUE: Bilateral screening digital craniocaudal and mediolateral oblique mammograms were obtained. Bilateral screening digital breast tomosynthesis was performed. The images were evaluated with computer-aided detection. COMPARISON:  Previous exam(s). ACR Breast Density Category b: There are scattered areas of fibroglandular density. FINDINGS: There are no  findings suspicious for malignancy. IMPRESSION: No mammographic evidence of malignancy. A result letter of this screening mammogram will be mailed directly to the patient. RECOMMENDATION: Screening mammogram in one year. (Code:SM-B-01Y) BI-RADS CATEGORY  1: Negative. Electronically Signed   By: Reyes Phi M.D.   On: 05/04/2022 16:53       Assessment & Plan:  Routine general medical examination at a health care facility  Health care maintenance Assessment & Plan: Physical today 11/12/23. SABRA  PAP 01/17/21 - negative with negative HPV. Repeat pap today. Mammogram 05/03/22 - Birads I.  Schedule f/u mammogram. Overdue.  Colonoscopy 04/03/23 - diverticulosis, melanosis.    Visit for screening mammogram -     3D Screening Mammogram, Left and Right; Future  Screening for cervical cancer -     Cytology - PAP  Abnormal liver enzymes Assessment & Plan: Recheck liver panel to confirm wnl. Discussed.   Orders: -     Hepatic function panel  Essential hypertension Assessment & Plan: On losartan  50mg  q day.  Blood pressures doing well. Follow pressures. Check metabolic panel.    Gastroesophageal reflux disease, unspecified whether esophagitis present Assessment & Plan: No upper symptoms. Nexium.    Hypercholesterolemia Assessment & Plan: Check lipid panel.  Previous calcium  score - 0 - on recent check.  LDL >200.  Was started on low dose crestor .  Low cholesterol diet and exercise.  Follow lipid panel and liver function tests.    Hypothyroidism, unspecified type Assessment & Plan: On thyroid  replacement. Follow tsh.    Stress Assessment & Plan: Increased stress. Discussed. Feels needs something to help level things out. Start zoloft . Follow. Get her back in soon to reassess.    Other orders -     Sertraline  HCl; Take 1/2 tablet q day x 1 week and then one po q day.  Dispense:  30 tablet; Refill: 2     Allena Hamilton, MD

## 2023-11-13 LAB — HEPATIC FUNCTION PANEL
ALT: 24 U/L (ref 0–35)
AST: 22 U/L (ref 0–37)
Albumin: 4.8 g/dL (ref 3.5–5.2)
Alkaline Phosphatase: 70 U/L (ref 39–117)
Bilirubin, Direct: 0.1 mg/dL (ref 0.0–0.3)
Total Bilirubin: 0.5 mg/dL (ref 0.2–1.2)
Total Protein: 7.1 g/dL (ref 6.0–8.3)

## 2023-11-14 ENCOUNTER — Ambulatory Visit: Payer: Self-pay | Admitting: Internal Medicine

## 2023-11-14 LAB — CYTOLOGY - PAP
Comment: NEGATIVE
Diagnosis: NEGATIVE
High risk HPV: NEGATIVE

## 2023-11-17 ENCOUNTER — Encounter: Payer: Self-pay | Admitting: Internal Medicine

## 2023-11-17 NOTE — Assessment & Plan Note (Signed)
 Increased stress. Discussed. Feels needs something to help level things out. Start zoloft . Follow. Get her back in soon to reassess.

## 2023-11-17 NOTE — Assessment & Plan Note (Signed)
Check lipid panel.  Previous calcium score - 0 - on recent check.  LDL >200.  Was started on low dose crestor.  Low cholesterol diet and exercise.  Follow lipid panel and liver function tests.

## 2023-11-17 NOTE — Assessment & Plan Note (Signed)
No upper symptoms.  Nexium.  

## 2023-11-17 NOTE — Assessment & Plan Note (Signed)
On losartan 50mg  q day.  Blood pressures doing well. Follow pressures.  Check metabolic panel.

## 2023-11-17 NOTE — Assessment & Plan Note (Signed)
 Recheck liver panel to confirm wnl. Discussed.

## 2023-11-17 NOTE — Assessment & Plan Note (Signed)
 On thyroid replacement.  Follow tsh.

## 2023-12-04 ENCOUNTER — Other Ambulatory Visit: Payer: Self-pay | Admitting: Internal Medicine

## 2023-12-05 NOTE — Telephone Encounter (Signed)
 I have refilled zoloft  for 90 days. Per last note, she was to schedule a soon f/u - we started her on zoloft . Please schedule f/u appt beginning of 12/2023.

## 2023-12-13 ENCOUNTER — Ambulatory Visit

## 2023-12-30 ENCOUNTER — Other Ambulatory Visit: Payer: Self-pay | Admitting: Internal Medicine

## 2024-01-21 ENCOUNTER — Encounter: Payer: Self-pay | Admitting: Internal Medicine

## 2024-01-21 ENCOUNTER — Ambulatory Visit: Admitting: Internal Medicine

## 2024-01-21 VITALS — BP 122/74 | HR 63 | Temp 97.7°F | Ht 62.0 in | Wt 142.8 lb

## 2024-01-21 DIAGNOSIS — E039 Hypothyroidism, unspecified: Secondary | ICD-10-CM

## 2024-01-21 DIAGNOSIS — R748 Abnormal levels of other serum enzymes: Secondary | ICD-10-CM

## 2024-01-21 DIAGNOSIS — F439 Reaction to severe stress, unspecified: Secondary | ICD-10-CM | POA: Diagnosis not present

## 2024-01-21 DIAGNOSIS — I10 Essential (primary) hypertension: Secondary | ICD-10-CM

## 2024-01-21 DIAGNOSIS — Z23 Encounter for immunization: Secondary | ICD-10-CM | POA: Diagnosis not present

## 2024-01-21 DIAGNOSIS — E7800 Pure hypercholesterolemia, unspecified: Secondary | ICD-10-CM | POA: Diagnosis not present

## 2024-01-21 DIAGNOSIS — K219 Gastro-esophageal reflux disease without esophagitis: Secondary | ICD-10-CM

## 2024-01-21 DIAGNOSIS — Z1211 Encounter for screening for malignant neoplasm of colon: Secondary | ICD-10-CM

## 2024-01-21 MED ORDER — LEVOTHYROXINE SODIUM 88 MCG PO TABS: 88.0000 ug | ORAL_TABLET | Freq: Every day | ORAL | 1 refills | Status: AC

## 2024-01-21 MED ORDER — ROSUVASTATIN CALCIUM 5 MG PO TABS: 5.0000 mg | ORAL_TABLET | Freq: Every day | ORAL | 1 refills | Status: AC

## 2024-01-21 MED ORDER — LOSARTAN POTASSIUM 50 MG PO TABS: 50.0000 mg | ORAL_TABLET | Freq: Every day | ORAL | 1 refills | Status: AC

## 2024-01-21 MED ORDER — SERTRALINE HCL 50 MG PO TABS: 50.0000 mg | ORAL_TABLET | Freq: Every day | ORAL | 1 refills | Status: AC

## 2024-01-21 NOTE — Progress Notes (Signed)
 Subjective:    [name], female    DOB: 1965/04/07, 58 y.o.   MRN: [id number]  Patient here for  Chief Complaint  Patient presents with   Medical Management of Chronic Issues    HPI Here for a scheduled follow up - follow up regarding hypertension, hypercholesterolemia, GERD and increased stress. Last visit, was started on zoloft . Is feeling better does feel zoloft  is helping. Feels this dose is working well. Trying to stay active. Breathing stable.    Past Medical History:  Diagnosis Date   GERD (gastroesophageal reflux disease)    Hypertension    Hypothyroidism    Past Surgical History:  Procedure Laterality Date   ABDOMINAL HYSTERECTOMY  2011   BREAST BIOPSY Left 12/26/2007   Dr Dellie- neg   CHOLECYSTECTOMY  03/2012   Dr LELON Sharps   UPPER GI ENDOSCOPY     upper Endoscopy (10-15-2010)    Family History  Problem Relation Age of Onset   Hypertension Mother    Breast cancer Mother 44       negative genetic testing   Heart block Father    Breast cancer Maternal Uncle    Heart disease Maternal Grandmother    Stroke Maternal Grandmother    Hypertension Maternal Grandmother    Heart disease Maternal Grandfather    Stroke Maternal Grandfather    Hypertension Maternal Grandfather    Diabetes Paternal Grandfather    Social History   Socioeconomic History   Marital status: Married    Spouse name: John   Number of children: 2   Years of education: Not on file   Highest education level: Associate degree: occupational, scientist, product/process development, or vocational program  Occupational History    Employer: [employer]  Tobacco Use   Smoking status: Former   Smokeless tobacco: Never   Tobacco comments:    smoked in [employer]   Vaping status: Never Used  Substance and Sexual Activity   Alcohol use: No    Alcohol/week: 0.0 standard drinks of alcohol   Drug use: No   Sexual activity: Not on file  Other Topics Concern   Not on file  Social History  Narrative   Not on file   Social Drivers of Health   Financial Resource Strain: Low Risk  (01/20/2024)   Overall Financial Resource Strain (CARDIA)    Difficulty of Paying Living Expenses: Not very hard  Food Insecurity: No Food Insecurity (01/20/2024)   Hunger Vital Sign    Worried About Running Out of Food in the Last Year: Never true    Ran Out of Food in the Last Year: Never true  Transportation Needs: No Transportation Needs (01/20/2024)   PRAPARE - Administrator, Civil Service (Medical): No    Lack of Transportation (Non-Medical): No  Physical Activity: Insufficiently Active (01/20/2024)   Exercise Vital Sign    Days of Exercise per Week: 4 days    Minutes of Exercise per Session: 30 min  Stress: Patient Declined (01/20/2024)   [employer] of Occupational Health - Occupational Stress Questionnaire    Feeling of Stress: Patient declined  Social Connections: Socially Integrated (01/20/2024)   Social Connection and Isolation Panel    Frequency of Communication with Friends and Family: More than three times a week    Frequency of Social Gatherings with Friends and Family: More than three times a week    Attends Religious Services: More than 4 times per year    Active  Member of Clubs or Organizations: Yes    Attends Engineer, Structural: More than 4 times per year    Marital Status: Married     Review of Systems  Constitutional:  Negative for appetite change and unexpected weight change.  HENT:  Negative for congestion and sinus pressure.   Respiratory:  Negative for cough, chest tightness and shortness of breath.   Cardiovascular:  Negative for chest pain, palpitations and leg swelling.  Gastrointestinal:  Negative for abdominal pain, diarrhea, nausea and vomiting.  Genitourinary:  Negative for difficulty urinating and dysuria.  Musculoskeletal:  Negative for joint swelling and myalgias.  Skin:  Negative for color change and rash.  Neurological:   Negative for dizziness and headaches.  Psychiatric/Behavioral:  Negative for agitation and dysphoric mood.        Objective:     BP 122/74   Pulse 63   Temp 97.7 F (36.5 C) (Oral)   Ht 5' 2 (1.575 m)   Wt 142 lb 12.8 oz (64.8 kg)   LMP 06/26/2009   SpO2 95%   BMI 26.12 kg/m  Wt Readings from Last 3 Encounters:  01/21/24 142 lb 12.8 oz (64.8 kg)  11/12/23 139 lb 3.2 oz (63.1 kg)  08/25/22 149 lb (67.6 kg)    Physical Exam Vitals reviewed.  Constitutional:      General: She is not in acute distress.    Appearance: Normal appearance.  HENT:     Head: Normocephalic and atraumatic.     Right Ear: External ear normal.     Left Ear: External ear normal.     Mouth/Throat:     Pharynx: No oropharyngeal exudate or posterior oropharyngeal erythema.  Eyes:     General: No scleral icterus.       Right eye: No discharge.        Left eye: No discharge.     Conjunctiva/sclera: Conjunctivae normal.  Neck:     Thyroid : No thyromegaly.  Cardiovascular:     Rate and Rhythm: Normal rate and regular rhythm.  Pulmonary:     Effort: No respiratory distress.     Breath sounds: Normal breath sounds. No wheezing.  Abdominal:     General: Bowel sounds are normal.     Palpations: Abdomen is soft.     Tenderness: There is no abdominal tenderness.  Musculoskeletal:        General: No swelling or tenderness.     Cervical back: Neck supple. No tenderness.  Lymphadenopathy:     Cervical: No cervical adenopathy.  Skin:    Findings: No erythema or rash.  Neurological:     Mental Status: She is alert.  Psychiatric:        Mood and Affect: Mood normal.        Behavior: Behavior normal.         Outpatient Encounter Medications as of 01/21/2024  Medication Sig   esomeprazole (NEXIUM) 20 MG capsule Take 20 mg by mouth daily at 12 noon.   hydrOXYzine  (ATARAX ) 10 MG tablet Take 1 tablet (10 mg total) by mouth 3 (three) times daily as needed.   triamcinolone  cream (KENALOG ) 0.1 %  Apply 1 application. topically 2 (two) times daily.   levothyroxine  (SYNTHROID ) 88 MCG tablet Take 1 tablet (88 mcg total) by mouth daily.   losartan  (COZAAR ) 50 MG tablet Take 1 tablet (50 mg total) by mouth daily.   rosuvastatin  (CRESTOR ) 5 MG tablet Take 1 tablet (5 mg total) by mouth daily.  sertraline  (ZOLOFT ) 50 MG tablet Take 1 tablet (50 mg total) by mouth daily.   [DISCONTINUED] levothyroxine  (SYNTHROID ) 88 MCG tablet TAKE 1 TABLET BY MOUTH EVERY DAY   [DISCONTINUED] losartan  (COZAAR ) 50 MG tablet TAKE 1 TABLET BY MOUTH EVERY DAY   [DISCONTINUED] rosuvastatin  (CRESTOR ) 5 MG tablet TAKE 1 TABLET (5 MG TOTAL) BY MOUTH DAILY.   [DISCONTINUED] sertraline  (ZOLOFT ) 50 MG tablet Take 1 tablet (50 mg total) by mouth daily.   No facility-administered encounter medications on file as of 01/21/2024.     Lab Results  Component Value Date   WBC 6.9 11/01/2023   HGB 12.2 11/01/2023   HCT 36.1 11/01/2023   PLT 278.0 11/01/2023   GLUCOSE 110 (H) 11/01/2023   CHOL 186 11/01/2023   TRIG 213.0 (H) 11/01/2023   HDL 42.00 11/01/2023   LDLDIRECT 107.0 08/25/2022   LDLCALC 102 (H) 11/01/2023   ALT 24 11/12/2023   AST 22 11/12/2023   NA 142 11/01/2023   K 4.0 11/01/2023   CL 108 11/01/2023   CREATININE 0.73 11/01/2023   BUN 9 11/01/2023   CO2 23 11/01/2023   TSH 3.14 11/01/2023       Assessment & Plan:  Need for influenza vaccination -     Flu vaccine trivalent PF, 6mos and older(Flulaval,Afluria,Fluarix,Fluzone)  Abnormal liver enzymes Assessment & Plan: Last liver panel 11/12/23 - wnl.   Orders: -     Hepatic function panel; Future  Hypercholesterolemia Assessment & Plan: Previous calcium  score 0. Low cholesterol diet and exercise. Continue crestor  - low dose. Follow lipid panel.   Orders: -     CBC with Differential/Platelet; Future -     Basic metabolic panel with GFR; Future -     Lipid panel; Future  Hypothyroidism, unspecified type Assessment & Plan: On thyroid   replacement. Follow tsh.   Orders: -     TSH; Future  Stress Assessment & Plan: Overall appears to be doing better. Continue zoloft . Follow.  Call if feels needs further intervention.    Essential hypertension Assessment & Plan: Continues on losartan . Blood pressure as outlined. No changes in medication. Follow metabolic panel and pressures.    Gastroesophageal reflux disease, unspecified whether esophagitis present Assessment & Plan: No upper symptoms reported. On nexium.    Colon cancer screening Assessment & Plan: Colonoscopy 04/03/23 - diverticulosis, melanosis. F/u colonoscopy in 10 years.    Other orders -     Levothyroxine  Sodium; Take 1 tablet (88 mcg total) by mouth daily.  Dispense: 90 tablet; Refill: 1 -     Losartan  Potassium; Take 1 tablet (50 mg total) by mouth daily.  Dispense: 90 tablet; Refill: 1 -     Rosuvastatin  Calcium ; Take 1 tablet (5 mg total) by mouth daily.  Dispense: 90 tablet; Refill: 1 -     Sertraline  HCl; Take 1 tablet (50 mg total) by mouth daily.  Dispense: 90 tablet; Refill: 1     Allena Hamilton, MD

## 2024-01-27 ENCOUNTER — Encounter: Payer: Self-pay | Admitting: Internal Medicine

## 2024-01-27 NOTE — Assessment & Plan Note (Signed)
 Previous calcium  score 0. Low cholesterol diet and exercise. Continue crestor  - low dose. Follow lipid panel.

## 2024-01-27 NOTE — Assessment & Plan Note (Signed)
 Colonoscopy 04/03/23 - diverticulosis, melanosis. F/u colonoscopy in 10 years.

## 2024-01-27 NOTE — Assessment & Plan Note (Signed)
 Last liver panel 11/12/23 - wnl.

## 2024-01-27 NOTE — Assessment & Plan Note (Signed)
No upper symptoms reported.  On nexium.   

## 2024-01-27 NOTE — Assessment & Plan Note (Signed)
 Continues on losartan . Blood pressure as outlined. No changes in medication. Follow metabolic panel and pressures.

## 2024-01-27 NOTE — Assessment & Plan Note (Signed)
 On thyroid replacement.  Follow tsh.

## 2024-01-27 NOTE — Assessment & Plan Note (Signed)
 Overall appears to be doing better. Continue zoloft . Follow.  Call if feels needs further intervention.

## 2024-02-25 ENCOUNTER — Ambulatory Visit

## 2024-04-28 ENCOUNTER — Encounter: Payer: Self-pay | Admitting: Internal Medicine

## 2024-04-29 NOTE — Telephone Encounter (Signed)
 Lvm for pt to return call. Need to clarify what medication she is needing refilled

## 2024-04-30 NOTE — Telephone Encounter (Signed)
 Copied from CRM #[id number]. Topic: Clinical - Prescription Issue >> Apr 30, 2024  2:44 PM Alfonso ORN wrote: Reason for CRM: pt calling to f/u on rx request as she stated she did not receive a call. Pt stated that she took this years ago but is requesting due to her trip to [place].  typhoid (VIVOTIF) DR capsule [[id number]] DISCONTINUED

## 2024-05-01 MED ORDER — TYPHOID VACCINE PO CPDR: 1.0000 | DELAYED_RELEASE_CAPSULE | ORAL | 0 refills | Status: AC

## 2024-05-01 MED ORDER — AZITHROMYCIN 250 MG PO TABS: ORAL_TABLET | ORAL | 0 refills | Status: AC

## 2024-05-01 NOTE — Telephone Encounter (Signed)
 The dose of Vivotif (typhoid vaccine live Ty21a) is 1 capsule taken orally every other day for a total of 4 doses (days 1, 3, 5, and 7), and it is approved for use in individuals aged 57 years and older.  Each capsule should be taken 1 hour before a meal with a cold or lukewarm drink.  All 4 doses should be completed at least 1 week prior to potential exposure.  The capsules must be kept refrigerated and should not be chewed. Rx sent in for above and discussed above with her.
